# Patient Record
Sex: Male | Born: 1960 | Race: White | Hispanic: No | Marital: Married | Smoking: Never smoker
Health system: Southern US, Community
[De-identification: ages and names within clinical notes are randomized; demographics above are authoritative.]

## PROBLEM LIST (undated history)

## (undated) DIAGNOSIS — K219 Gastro-esophageal reflux disease without esophagitis: Secondary | ICD-10-CM

## (undated) DIAGNOSIS — R011 Cardiac murmur, unspecified: Secondary | ICD-10-CM

## (undated) DIAGNOSIS — R519 Headache, unspecified: Secondary | ICD-10-CM

## (undated) DIAGNOSIS — T4145XA Adverse effect of unspecified anesthetic, initial encounter: Secondary | ICD-10-CM

## (undated) DIAGNOSIS — E7800 Pure hypercholesterolemia, unspecified: Secondary | ICD-10-CM

## (undated) DIAGNOSIS — Z87442 Personal history of urinary calculi: Secondary | ICD-10-CM

## (undated) DIAGNOSIS — R51 Headache: Secondary | ICD-10-CM

## (undated) DIAGNOSIS — M1990 Unspecified osteoarthritis, unspecified site: Secondary | ICD-10-CM

## (undated) DIAGNOSIS — I10 Essential (primary) hypertension: Secondary | ICD-10-CM

## (undated) DIAGNOSIS — H9319 Tinnitus, unspecified ear: Secondary | ICD-10-CM

## (undated) DIAGNOSIS — T8859XA Other complications of anesthesia, initial encounter: Secondary | ICD-10-CM

## (undated) HISTORY — PX: OTHER SURGICAL HISTORY: SHX169

## (undated) HISTORY — PX: COLONOSCOPY: SHX174

## (undated) HISTORY — PX: KNEE ARTHROSCOPY: SUR90

---

## 1990-06-05 HISTORY — PX: TIBIA FRACTURE SURGERY: SHX806

## 2003-01-28 ENCOUNTER — Emergency Department (HOSPITAL_COMMUNITY): Admission: EM | Admit: 2003-01-28 | Discharge: 2003-01-28 | Payer: Self-pay | Admitting: Emergency Medicine

## 2003-06-24 ENCOUNTER — Ambulatory Visit (HOSPITAL_COMMUNITY): Admission: RE | Admit: 2003-06-24 | Discharge: 2003-06-24 | Payer: Self-pay | Admitting: Family Medicine

## 2003-09-09 ENCOUNTER — Ambulatory Visit (HOSPITAL_COMMUNITY): Admission: RE | Admit: 2003-09-09 | Discharge: 2003-09-09 | Payer: Self-pay | Admitting: Orthopedic Surgery

## 2003-09-14 ENCOUNTER — Encounter (HOSPITAL_COMMUNITY): Admission: RE | Admit: 2003-09-14 | Discharge: 2003-10-14 | Payer: Self-pay | Admitting: Orthopedic Surgery

## 2003-10-19 ENCOUNTER — Ambulatory Visit (HOSPITAL_COMMUNITY): Admission: RE | Admit: 2003-10-19 | Discharge: 2003-10-19 | Payer: Self-pay | Admitting: Orthopedic Surgery

## 2004-04-20 ENCOUNTER — Ambulatory Visit: Payer: Self-pay | Admitting: Orthopedic Surgery

## 2004-05-02 ENCOUNTER — Ambulatory Visit (HOSPITAL_COMMUNITY): Admission: RE | Admit: 2004-05-02 | Discharge: 2004-05-02 | Payer: Self-pay | Admitting: Orthopedic Surgery

## 2004-05-11 ENCOUNTER — Ambulatory Visit: Payer: Self-pay | Admitting: Orthopedic Surgery

## 2004-05-23 ENCOUNTER — Ambulatory Visit (HOSPITAL_COMMUNITY): Admission: RE | Admit: 2004-05-23 | Discharge: 2004-05-23 | Payer: Self-pay | Admitting: Family Medicine

## 2004-06-01 ENCOUNTER — Ambulatory Visit (HOSPITAL_COMMUNITY): Admission: RE | Admit: 2004-06-01 | Discharge: 2004-06-01 | Payer: Self-pay | Admitting: Family Medicine

## 2004-08-08 ENCOUNTER — Ambulatory Visit: Payer: Self-pay | Admitting: Orthopedic Surgery

## 2004-08-19 ENCOUNTER — Ambulatory Visit (HOSPITAL_COMMUNITY): Admission: RE | Admit: 2004-08-19 | Discharge: 2004-08-19 | Payer: Self-pay | Admitting: Orthopedic Surgery

## 2004-08-19 ENCOUNTER — Ambulatory Visit: Payer: Self-pay | Admitting: Orthopedic Surgery

## 2004-08-22 ENCOUNTER — Ambulatory Visit: Payer: Self-pay | Admitting: Orthopedic Surgery

## 2004-09-08 ENCOUNTER — Ambulatory Visit: Payer: Self-pay | Admitting: Orthopedic Surgery

## 2004-09-29 ENCOUNTER — Ambulatory Visit: Payer: Self-pay | Admitting: Orthopedic Surgery

## 2004-10-24 ENCOUNTER — Ambulatory Visit: Payer: Self-pay | Admitting: Orthopedic Surgery

## 2004-11-28 ENCOUNTER — Ambulatory Visit: Payer: Self-pay | Admitting: Orthopedic Surgery

## 2005-01-23 ENCOUNTER — Ambulatory Visit: Payer: Self-pay | Admitting: Orthopedic Surgery

## 2006-06-25 ENCOUNTER — Ambulatory Visit: Payer: Self-pay | Admitting: Orthopedic Surgery

## 2006-07-28 IMAGING — RF DG ARTHROGRAM KNEE*R*
1 series · 1 of 1 positions shown · IV contrast (omniscan)
Comparison: none

CLINICAL DATA: 43-year-old with right knee pain.
 RIGHT KNEE ARTHROGRAM:
 Written informed consent was obtained.  I explained the procedure to the patient in detail.  Appropriate site for arthrocentesis was marked on the patient?s skin.  The patient was prepped and draped in the usual sterile fashion.  Local anesthesia was achieved with 2% Xylocaine.  22-gauge needle was then placed into the suprapatellar bursa and a total of 10 cc of a combination of Hypaque-VZ, 2% Xylocaine, and Omniscan was injected.  Fluoroscopic spot images demonstrate contrast throughout the joint.  The patient tolerated the procedure well without immediate complications.

[Series 1: run · 1 of 1 slices shown]
[im 1/1]
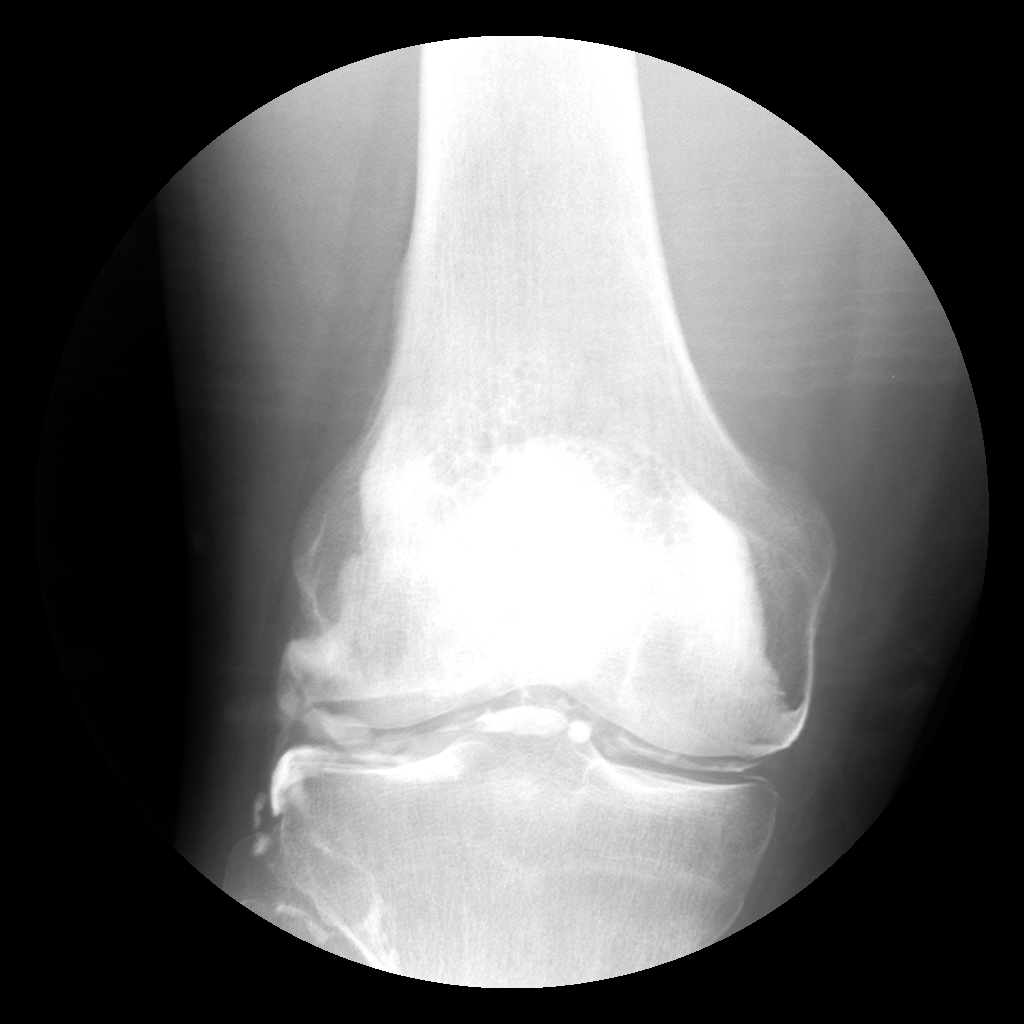

[1 of 1 positions shown; findings below may reference images not displayed]

IMPRESSION: Right knee injection for MRI.

## 2006-09-17 ENCOUNTER — Ambulatory Visit: Payer: Self-pay | Admitting: Orthopedic Surgery

## 2009-09-30 ENCOUNTER — Ambulatory Visit (HOSPITAL_COMMUNITY): Admission: RE | Admit: 2009-09-30 | Discharge: 2009-09-30 | Payer: Self-pay | Admitting: Family Medicine

## 2010-05-25 ENCOUNTER — Ambulatory Visit (HOSPITAL_COMMUNITY)
Admission: RE | Admit: 2010-05-25 | Discharge: 2010-05-25 | Payer: Self-pay | Source: Home / Self Care | Attending: Family Medicine | Admitting: Family Medicine
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
## 2010-10-21 NOTE — Op Note (Signed)
NAME:  Kirk Mueller, Kirk Mueller NO.:  [id number]   MEDICAL RECORD NO.:  [id number]                  PATIENT TYPE:  AMB   LOCATION:                                       FACILITY:   PHYSICIAN:  Vickki Hearing, M.D.           DATE OF BIRTH:  06/10/1971   DATE OF PROCEDURE:  09/09/2003  DATE OF DISCHARGE:                                 OPERATIVE REPORT   Mr. Kirk Mueller is 50 years old.  He had a right knee arthroscopy at [facility] [place]  several years ago did well, until recently, when he started having medial  knee pain.  We did an MRI on him and it showed an osteochondral filling  defect of his femur possible loose body.  He apparently had had a medial  meniscectomy at that time, at [facility] [place].   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   POSTOPERATIVE DIAGNOSIS:  Chondral flap tear tibial plateau right knee.   OPERATIVE FINDINGS:  There were 2 femoral lesions that appeared to be from  previous arthroscopy and were iatrogenic and should not be symptomatic.  There was a chondral flap tear of the tibial plateau in the weightbearing  surface.  There was softening and grade 1 changes of the remaining tibial  plateau approximately 8-10 cm in length.   The remaining portions of the knee showed intense hypertrophy of the fat pad  with scarring, ACL/PCL intact; medial and lateral gutter; lateral meniscus;  lateral compartment intact; patellofemoral joint intact.   PROCEDURE:  Mr. Kirk Mueller was identified properly in the holding area, the  medical record including history and physical and consent form were  reviewed.  The right knee was marked for surgery.  My initials were placed  over the right knee and we proceeded with Ancef and took him to surgery.  He  had a spinal anesthetic.  He was placed supine.  His right leg was prepped  and draped using sterile technique.  At that time we took a time out as  required.  We confirmed  the patient as Kirk Mueller, the procedure as right  knee arthroscopy.  The diagnosis as osteochondral defect.   We did a 3-portal arthroscopy with diagnostic arthroscopy.  I have listed  the findings above.  After thorough inspection of the knee the only lesion  noted was the chondral flap tear of the tibial plateau.  Using a combination  of a turbo whisker, meniscal shaver and rasp, the lesion was debrided.  The  knee was then irrigated, suctioned and the portals were closed with Steri-  Strips.  We injected 30 cc of Marcaine, applied a sterile dressing and a  CryoCuff and then took the patient to the recovery room in stable condition.  The postoperative plan is for him to return to the office in 2 days. He can  start immediate range  of motion exercises and straight leg raises.  He  should be started on chondral supplementation orally, take  antiinflammatories and modify his activities.      ___________________________________________                                            Vickki Hearing, M.D.   SEH/MEDQ  D:  09/09/2003  T:  09/09/2003  Job:  [id number]

## 2010-10-21 NOTE — Op Note (Signed)
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   OPERATIVE FINDINGS:  Patella chondromalacia, medial facet; medial plica;  medial tibial plateau chondromalacia.  Both areas of chondromalacia were  graded as grade 2.   PROCEDURE:  Arthroscopy, chondroplasty and resection of plica.  Chondroplasties were done on the tibial plateau and the medial patellar  facet.   SURGEON:  Vickki Hearing, M.D.   ANESTHETIC:  Spinal.   HISTORY:  This is a 50 year old male who had two knee arthroscopies,  continued to have pain.  Repeat MRI with contrast showed a medial plica,  chondromalacia with fissuring of the medial patellar facet, and also the  possibility of meniscal tear though no meniscal tear was found.   Primary indication was persistent pain.   Giomar Gusler was identified in preop holding area, is history and physical was  updated, as right knee was marked as the surgical site.  He was given Ancef  and taken to the operating room for spinal anesthetic.  After successful anesthesia, his right leg was prepped and draped using the  normal sterile technique.  A time-out was taken and completed as required.  The procedure was confirmed, extremity was confirmed, patient's  identification was confirmed, antibiotics were given, the equipment needed  for surgery was present.   A two-incision arthroscopy was performed.  Diagnostic arthroscopy was done.  The medial plica was resected using a shaver.  The chondroplasty on the  patella  was performed using a straight shaver and a Paragon wand.  This was  also used to perform a tibial plateau chondroplasty.  The knee was then  irrigated and the portals were closed with Steri-Strips.  One accessory  portal was created to better assess the chondral lesion in the patella.   The knee was dressed sterilely and a CryoCoff cuff was placed.  The patient  was taken to the recovery room in stable condition.      SEH/MEDQ  D:  08/20/2004  T:  08/22/2004  Job:  [id number]

## 2010-10-21 NOTE — H&P (Signed)
NAME:  Kirk, Mueller NO.:  [id number]   MEDICAL RECORD NO.:  [id number]          PATIENT TYPE:  AMB   LOCATION:  DAY                           FACILITY:  [facility]   PHYSICIAN:  Vickki Hearing, M.D.DATE OF BIRTH:  11/15/1960   DATE OF ADMISSION:  DATE OF DISCHARGE:  LH                                HISTORY & PHYSICAL   CHIEF COMPLAINT:  Persistent right knee pain.   Kirk Mueller is status post arthroscopy of the right knee on September 09, 2003.  He had had a previous arthroscopy many years ago at [facility]  At the time  of surgery we found a chondral flap tear of his medial tibial plateau.  His  patellofemoral joint was normal.  That tibial lesion was debrided.  He also  had an indentation from previous arthroscopy on his medial femoral condyle,  but the cartilage was intact.   He did well initially in physical therapy, and then as his exercises  increased, he started to have increased pain.  We sought a second opinion in  [place].  They recommended anti-inflammatories and a patellofemoral  brace.  He did that.  He also developed tendinitis.  He was injected for  that.  He was kept in physical therapy.  However, he continues to have  peripatellar pain.  He had a repeat MRI which showed a medial plica.  There  was some degenerative fraying and surface tearing of the medial meniscus,  and there appeared to be a fissure on the medial patellar facette and  perhaps some medial compartment cartilage irregularity which most likely is  from his previous surgery.   The primary problem is he still has pain, and he is unhappy with the way his  knee is functioning and wishes to participate in more vigorous activity and  presents for arthroscopic surgery of the right knee.   REVIEW OF SYSTEMS:  The patient has seasonal allergies, sinusitis, tinnitus.  He has a history of C5-C6 neck injury, left distal tib/fib fracture with  internal fixation and subsequent hardware  removal.  He takes no medications  and has no known allergies.  He has a negative family history.  He is  employed as a Quarry manager, and he is married. He does not smoke or  drink and has an associate's degree.   PHYSICAL EXAMINATION:  Weight is 185.  HEENT:  Normal.  NECK:  Supple.  CHEST:  Clear.  HEART:  Rate and rhythm are normal.  ABDOMEN:  Soft.  EXTREMITIES:  Physical examination of the right knee shows that he does have  a palpable plica over the medial peripatellar area.  There is some  tenderness also on the medial compartment over the tibia.  There is some  tenderness on the medial facette of the patella and medial femoral condyle.  Range of motion is good.  Strength is good.  No atrophy is really noted  clinically.   IMPRESSION:  1.  Plica.  2.  Medial femoral condyle defect.  3.  Tibial plateau defect.  4.  Patellofemoral chondromalacia.  Recommend arthroscopy, right knee.      SEH/MEDQ  D:  08/18/2004  T:  08/18/2004  Job:  [id number]

## 2011-04-10 ENCOUNTER — Encounter: Payer: Self-pay | Admitting: *Deleted

## 2011-04-10 ENCOUNTER — Emergency Department (HOSPITAL_COMMUNITY)
Admission: EM | Admit: 2011-04-10 | Discharge: 2011-04-10 | Disposition: A | Discharge status: Home / Self Care | Payer: BC Managed Care – PPO | Attending: Emergency Medicine | Admitting: Emergency Medicine

## 2011-04-10 DIAGNOSIS — I10 Essential (primary) hypertension: Secondary | ICD-10-CM | POA: Insufficient documentation

## 2011-04-10 DIAGNOSIS — L239 Allergic contact dermatitis, unspecified cause: Secondary | ICD-10-CM

## 2011-04-10 DIAGNOSIS — E7800 Pure hypercholesterolemia, unspecified: Secondary | ICD-10-CM | POA: Insufficient documentation

## 2011-04-10 DIAGNOSIS — L259 Unspecified contact dermatitis, unspecified cause: Secondary | ICD-10-CM | POA: Insufficient documentation

## 2011-04-10 HISTORY — DX: Pure hypercholesterolemia, unspecified: E78.00

## 2011-04-10 MED ORDER — PREDNISONE 10 MG PO TABS: ORAL_TABLET | ORAL | Status: DC

## 2011-04-10 MED ORDER — FAMOTIDINE 20 MG PO TABS
20.0000 mg | ORAL_TABLET | Freq: Once | ORAL | Status: AC
  Administered 2011-04-10: 20 mg via ORAL
  Filled 2011-04-10: qty 1

## 2011-04-10 MED ORDER — DEXAMETHASONE SODIUM PHOSPHATE 10 MG/ML IJ SOLN
10.0000 mg | Freq: Once | INTRAMUSCULAR | Status: AC
  Administered 2011-04-10: 10 mg via INTRAVENOUS
  Filled 2011-04-10: qty 1

## 2011-04-10 MED ORDER — DIPHENHYDRAMINE HCL 25 MG PO CAPS
50.0000 mg | ORAL_CAPSULE | Freq: Once | ORAL | Status: AC
  Administered 2011-04-10: 50 mg via ORAL
  Filled 2011-04-10: qty 2

## 2011-04-10 NOTE — ED Notes (Signed)
Family at bedside. Patient is comfortable but his rash is spreading more through his hands and wrist. Patient states his arm pits and in between his fingers is irritated and itching. RN Kendal Hymen notified.

## 2011-04-10 NOTE — ED Notes (Signed)
Pt c/o rash all over body that started this am; pt states he was wearing gloves this am; pt c/o tingling to lips

## 2011-04-11 ENCOUNTER — Emergency Department (HOSPITAL_COMMUNITY)
Admission: EM | Admit: 2011-04-11 | Discharge: 2011-04-12 | Disposition: A | Discharge status: Home / Self Care | Payer: BC Managed Care – PPO | Attending: Emergency Medicine | Admitting: Emergency Medicine

## 2011-04-11 ENCOUNTER — Encounter (HOSPITAL_COMMUNITY): Payer: Self-pay | Admitting: *Deleted

## 2011-04-11 DIAGNOSIS — L509 Urticaria, unspecified: Secondary | ICD-10-CM | POA: Insufficient documentation

## 2011-04-11 DIAGNOSIS — Z79899 Other long term (current) drug therapy: Secondary | ICD-10-CM | POA: Insufficient documentation

## 2011-04-11 DIAGNOSIS — R21 Rash and other nonspecific skin eruption: Secondary | ICD-10-CM | POA: Insufficient documentation

## 2011-04-11 DIAGNOSIS — I10 Essential (primary) hypertension: Secondary | ICD-10-CM | POA: Insufficient documentation

## 2011-04-11 HISTORY — DX: Essential (primary) hypertension: I10

## 2011-04-11 MED ORDER — LORAZEPAM 2 MG/ML IJ SOLN
1.0000 mg | Freq: Once | INTRAMUSCULAR | Status: AC
  Administered 2011-04-12: 1 mg via INTRAVENOUS
  Filled 2011-04-11: qty 1

## 2011-04-11 MED ORDER — DIPHENHYDRAMINE HCL 50 MG/ML IJ SOLN
25.0000 mg | Freq: Once | INTRAMUSCULAR | Status: AC
  Administered 2011-04-12: 25 mg via INTRAVENOUS
  Filled 2011-04-11: qty 1

## 2011-04-11 NOTE — ED Notes (Signed)
Itching rash,  Seen here for same

## 2011-04-12 ENCOUNTER — Encounter (HOSPITAL_COMMUNITY): Payer: Self-pay | Admitting: Emergency Medicine

## 2011-04-12 MED ORDER — FAMOTIDINE IN NACL 20-0.9 MG/50ML-% IV SOLN: 20.0000 mg | Freq: Once | INTRAVENOUS | Status: DC

## 2011-04-12 MED ORDER — FAMOTIDINE IN NACL 20-0.9 MG/50ML-% IV SOLN
INTRAVENOUS | Status: AC
  Filled 2011-04-12: qty 50

## 2011-04-12 MED ORDER — HYDROXYZINE HCL 25 MG PO TABS: 25.0000 mg | ORAL_TABLET | Freq: Four times a day (QID) | ORAL | Status: AC

## 2011-04-12 MED ORDER — FAMOTIDINE 20 MG PO TABS: 20.0000 mg | ORAL_TABLET | Freq: Two times a day (BID) | ORAL | Status: DC
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
## 2011-04-12 NOTE — ED Provider Notes (Signed)
Medical screening examination/treatment/procedure(s) were performed by non-physician practitioner and as supervising physician I was immediately available for consultation/collaboration.  Jasmine Awe, MD 04/12/11 [phone]

## 2011-04-12 NOTE — ED Provider Notes (Signed)
Medical screening examination/treatment/procedure(s) were performed by non-physician practitioner and as supervising physician I was immediately available for consultation/collaboration.   Shelda Jakes, MD 04/12/11 1134

## 2011-04-12 NOTE — ED Provider Notes (Signed)
History     CSN: [id number] Arrival date & time: 04/11/2011 11:27 PM   First MD Initiated Contact with Patient 04/11/11 2329      Chief Complaint  Patient presents with  . Allergic Reaction    (Consider location/radiation/quality/duration/timing/severity/associated sxs/prior treatment) Patient is a 50 y.o. male presenting with allergic reaction. The history is provided by the patient.  Allergic Reaction The primary symptoms are  rash and urticaria. The primary symptoms do not include wheezing, shortness of breath, cough, abdominal pain, nausea, vomiting, dizziness, palpitations, altered mental status or angioedema. The current episode started yesterday. The problem has been gradually worsening. This is a new problem.  The rash began yesterday. The rash appears on the chest, back, abdomen, left leg, left foot, right arm, right foot, right leg, right hand and left hand. The rash is associated with itching. The rash is not associated with blisters or weeping.  The urticaria began yesterday. The urticaria has been gradually worsening since its onset. Urticaria is a new problem. Urticaria is located on the back, chest, abdomen, left arm, left hand, left leg, left foot, right arm, right hand, right leg and right foot.  Associated with: unknown. Significant symptoms also include itching.    Past Medical History  Diagnosis Date  . Hypercholesteremia   . Hypertension     Past Surgical History  Procedure Date  . Colonoscopy   . Left leg surgery   . Knee arthroscopy right  . Brown recluse bite     History reviewed. No pertinent family history.  History  Substance Use Topics  . Smoking status: Never Smoker   . Smokeless tobacco: Not on file  . Alcohol Use: No      Review of Systems  Constitutional: Negative for fever, chills, activity change, appetite change and fatigue.  HENT: Negative for sore throat, facial swelling, trouble swallowing, neck pain and neck stiffness.   Eyes:  Negative for visual disturbance.  Respiratory: Negative for cough, chest tightness, shortness of breath and wheezing.   Cardiovascular: Negative for chest pain and palpitations.  Gastrointestinal: Negative for nausea, vomiting and abdominal pain.  Genitourinary: Negative for dysuria.  Musculoskeletal: Negative for myalgias, back pain and arthralgias.  Skin: Positive for itching and rash.  Neurological: Negative for dizziness, weakness and numbness.  Hematological: Negative for adenopathy. Does not bruise/bleed easily.  Psychiatric/Behavioral: Negative for confusion, decreased concentration and altered mental status.  All other systems reviewed and are negative.    Allergies  Review of patient's allergies indicates no known allergies.  Home Medications   Current Outpatient Rx  Name Route Sig Dispense Refill  . CELECOXIB 200 MG PO CAPS Oral Take 200 mg by mouth 2 (two) times daily. As needed     . DIPHENHYDRAMINE HCL (SLEEP) 25 MG PO TABS Oral Take 50 mg by mouth at bedtime as needed. As needed , took pta     . ROSUVASTATIN CALCIUM 5 MG PO TABS Oral Take 5 mg by mouth daily.      Marland Kitchen PREDNISONE 10 MG PO TABS  Take 6 tabs daily by mouth for 1day,  Then 5 tab daily for 1day,  4 tabs daily for 1 day,  3 tabs daily for 1 day,  2 tabs daily for 1 day,  Then 1 tab daily for 1 day. 15 tablet 0    BP 139/94  Pulse 74  Temp(Src) 97.8 F (36.6 C) (Oral)  Resp 20  Wt 184 lb (83.462 kg)  SpO2 100%  Physical Exam  Nursing note and vitals reviewed. Constitutional: He is oriented to person, place, and time. He appears well-developed and well-nourished. No distress.  HENT:  Head: Normocephalic and atraumatic.  Mouth/Throat: Oropharynx is clear and moist.  Eyes: EOM are normal. Pupils are equal, round, and reactive to light.  Neck: Normal range of motion. Neck supple.  Cardiovascular: Normal rate, regular rhythm and normal heart sounds.   Pulmonary/Chest: Effort normal and breath sounds  normal. No respiratory distress. He exhibits no tenderness.  Abdominal: Soft. He exhibits no distension. There is no tenderness.  Musculoskeletal: Normal range of motion. He exhibits no edema and no tenderness.  Lymphadenopathy:    He has no cervical adenopathy.  Neurological: He is alert and oriented to person, place, and time. He displays normal reflexes. No cranial nerve deficit. He exhibits normal muscle tone. Coordination normal.  Skin: Skin is warm and dry. Rash noted. No petechiae noted. Rash is urticarial. Rash is not nodular and not vesicular.        Diffuse, patchy hives to the trunk and bilateral upper and lower extremities.    ED Course  Procedures (including critical care time)       MDM     12:01 AM patient is alert, appears anxious.  NAD.  No edema or airway compromise.  Diffuse, patchy hives to the trunk and bilateral upper and lower extremities.  Patient is currently taking po steroids and received steroid injection today by his PMD.    1:01 AM patient is feeling better. Drinking fluids.  Hives have resolved.  Airway remains clear.  I will have pt continue his prednisone, d/c benadryl and start hydroxyzine for the itching and also begin pepcid for one week and zyrtec.  Also advised to d/c the celebrex.   Pt agrees to f/u with his PMD or return here if needed.       Prima Rayner L. Porschia [place], [place] 04/12/11 0105

## 2016-04-24 NOTE — Progress Notes (Signed)
Scheduling pre op-  PLEASE PLACE SURGICAL ORDERS IN EPIC   thanks 

## 2016-04-25 ENCOUNTER — Ambulatory Visit: Payer: Self-pay | Admitting: Orthopedic Surgery

## 2016-05-08 ENCOUNTER — Ambulatory Visit: Payer: Self-pay | Admitting: Orthopedic Surgery

## 2016-05-08 NOTE — H&P (Signed)
Patterson HammersmithJohn H Griffie is an 55 y.o. male.   Chief Complaint: RLE radicular pain HPI: The patient is a 55 year old male who presents with back pain. The patient reports low back symptoms including pain which began 4 month(s) ago without any known injury. Symptoms are reported to be located in the low back and Symptoms include pain, numbness and weakness (RLE). The pain radiates to the right buttock, right lateral thigh and right lateral lower leg. The patient describes the pain as sharp. The patient describes the severity of their symptoms as severe. Symptoms are exacerbated by sitting. Current treatment includes non-opioid analgesics (Tramadol) and oral corticosteroids. Prior to being seen today the patient was previously evaluated by Dr. Zachery DauerBarnes and Dr. Ethelene Halamos. Past evaluation has included x-ray of the lumbar spine and MRI of the lumbar spine. Past treatment has included non-opioid analgesics, opioid analgesics, corticosteroids and physical therapy.  Reports persistent right lower extremity radicular pain, worse with activity, better with rest. He has had this for close to over four months. He has had physical therapy, activity modification, epidural at L4-5. He has reported even longer than that. He was in the [employer]. He did a lot of jumping out of planes. 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 Disc degeneration is noted at L5-S1 to the left and small disc protrusion. It will radiate down into the ankle and outside of the ankle. Minimal back pain.  He did physical therapy as well to no avail.  He has also gone to the Emergency Room.  Past Medical History:  Diagnosis Date  . Hypercholesteremia   . Hypertension     Past Surgical History:  Procedure Laterality Date  . brown recluse bite    . COLONOSCOPY    . KNEE ARTHROSCOPY  right  . left leg surgery      No family history on file. Social History:  reports that he has never smoked. He does not have any smokeless  tobacco history on file. He reports that he does not drink alcohol or use drugs.  Allergies: No Known Allergies   (Not in a hospital admission)  No results found for this or any previous visit (from the past 48 hour(s)). No results found.  Review of Systems  Constitutional: Negative.   HENT: Negative.   Eyes: Negative.   Respiratory: Negative.   Cardiovascular: Negative.   Gastrointestinal: Negative.   Genitourinary: Negative.   Musculoskeletal: Positive for back pain.  Skin: Negative.   Neurological: Positive for sensory change and focal weakness.  Psychiatric/Behavioral: Negative.     There were no vitals taken for this visit. Physical Exam  Constitutional: He is oriented to person, place, and time. He appears well-developed. He appears distressed.  HENT:  Head: Normocephalic.  Eyes: Pupils are equal, round, and reactive to light.  Neck: Normal range of motion.  Cardiovascular: Normal rate.   Respiratory: Effort normal.  GI: Soft.  Musculoskeletal:  Severe distress. Standing. Walks with an antalgic gait. Mood and affect his appropriate. Straight leg raise, buttock, thigh, and calf pain on the right, negative on the left. EHLs 4+/5 on the right compared to the left, slightly diminished repetitive plantarflexion. Slightly diminished Achilles reflex on the right. Lumbar spine exam reveals no evidence of soft tissue swelling, deformity or skin ecchymosis. On palpation there is no tenderness of the lumbar spine. No flank pain with percussion. The abdomen is soft and nontender. Nontender over the trochanters. No cellulitis or lymphadenopathy.  Good range of motion of the lumbar spine without associated pain. Straight leg raise is negative. Motor is 5/5 including EHL, tibialis anterior, plantar flexion, quadriceps and hamstrings. Patient is normoreflexic. There is no Babinski or clonus. Sensory exam is intact to light touch. Patient has good distal pulses. No DVT. No pain and normal  range of motion without instability of the hips, knees and ankles.  Neurological: He is alert and oriented to person, place, and time.    Three-view radiographs, AP, lateral, flexion and extension demonstrate disc space narrowing at L4-5 and L5-S1. Mo instability in flexion or extension. No pars defect. Mild osteoarthrosis of the hips.  MRI demonstrates a caudal displacement of the L4-5 disc down into the foramen of L5, near the foramen of L5.  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  We discussed options. He has failed conservative treatment and presence of neurologic deficit. We discussed proceeding with a micro lumbar decompression.  I had an extensive discussion of the risks and benefits of the lumbar decompression with the patient including bleeding, infection, damage to neurovascular structures, epidural fibrosis, CSF leak requiring repair. We also discussed increase in pain, adjacent segment disease, recurrent disc herniation, need for future surgery including repeat decompression and/or fusion. We also discussed risks of postoperative hematoma, paralysis, anesthetic complications including DVT, PE, death, cardiopulmonary dysfunction. In addition, the perioperative and postoperative courses were discussed in detail including the rehabilitative time and return to functional activity and work. I provided the patient with an illustrated handout and utilized the appropriate surgical models.  It indicate though that we will most likely require entering through two interlaminar windows given that it is caudally extruded beneath the L5 lamina. He does have some S1 symptomatology. I will proceed as soon as possible and to have that scheduled. I appreciate the kind referral by Dr. Ethelene Halamos. If he has any worsening in the interim, he is to call. We discussed activity modification and postoperative course in  detail. I placed him on gabapentin.  Plan microlumbar decompression L4-5, possible L5-S1  Dorothy SparkBISSELL, JACLYN M., PA-C for Dr. Shelle IronBeane 05/08/2016, 2:11 PM

## 2016-05-10 NOTE — Patient Instructions (Signed)
Patterson HammersmithJohn H Viglione  05/10/2016   Your procedure is scheduled on: 05/17/2016    [facility]  [facility] to 3rd floor to  Short Stay Center at   0900 AM.  Call this number if you have problems the morning of surgery [phone]   Remember: ONLY 1 PERSON MAY GO WITH YOU TO SHORT STAY TO GET  READY MORNING OF YOUR SURGERY.  Do not eat food or drink liquids :After Midnight.     Take these medicines the morning of surgery with A SIP OF WATER: Metoprolol ( Lopressor), Gabapentin ( Neurontin)                                 You may not have any metal on your body including hair pins and              piercings  Do not wear jewelry, , lotions, powders or perfumes, deodorant .              Men may shave face and neck.   Do not bring valuables to the hospital. [facility] IS NOT             RESPONSIBLE   FOR VALUABLES.  Contacts, dentures or bridgework may not be worn into surgery.  Leave suitcase in the car. After surgery it may be brought to your room.        Special Instructions: N/A              Please read over the following fact sheets you were given: _____________________________________________________________________             [facility] Pre[facility] Before surgery, you can play an important role.  Because skin is not sterile, your skin needs to be as free of germs as possible.  You can reduce the number of germs on your skin by washing with CHG (chlorahexidine gluconate) soap before surgery.  CHG is an antiseptic cleaner which kills germs and bonds with the skin to continue killing germs even after washing. Please DO NOT use if you have an allergy to CHG or antibacterial soaps.  If your skin becomes reddened/irritated stop using the CHG and inform your nurse when you arrive at Short Stay. Do not shave (including legs and underarms) for at least 48 hours prior to the first CHG shower.  You may shave your  face/neck. Please follow these instructions carefully:  1.  Shower with CHG Soap the night before surgery and the  morning of Surgery.  2.  If you choose to wash your hair, wash your hair first as usual with your  normal  shampoo.  3.  After you shampoo, rinse your hair and body thoroughly to remove the  shampoo.                           4.  Use CHG as you would any other liquid soap.  You can apply chg directly  to the skin and wash                       Gently with a scrungie or clean washcloth.  5.  Apply the CHG Soap to your body ONLY FROM THE NECK DOWN.  Do not use on face/ open                           Wound or open sores. Avoid contact with eyes, ears mouth and genitals (private parts).                       Wash face,  Genitals (private parts) with your normal soap.             6.  Wash thoroughly, paying special attention to the area where your surgery  will be performed.  7.  Thoroughly rinse your body with warm water from the neck down.  8.  DO NOT shower/wash with your normal soap after using and rinsing off  the CHG Soap.                9.  Pat yourself dry with a clean towel.            10.  Wear clean pajamas.            11.  Place clean sheets on your bed the night of your first shower and do not  sleep with pets. Day of Surgery : Do not apply any lotions/deodorants the morning of surgery.  Please wear clean clothes to the hospital/surgery center.  FAILURE TO FOLLOW THESE INSTRUCTIONS MAY RESULT IN THE CANCELLATION OF YOUR SURGERY PATIENT SIGNATURE_________________________________  NURSE SIGNATURE__________________________________  ________________________________________________________________________  WHAT IS A BLOOD TRANSFUSION? Blood Transfusion Information  A transfusion is the replacement of blood or some of its parts. Blood is made up of multiple cells which provide different functions.  Red blood cells carry oxygen and are used for blood loss  replacement.  White blood cells fight against infection.  Platelets control bleeding.  Plasma helps clot blood.  Other blood products are available for specialized needs, such as hemophilia or other clotting disorders. BEFORE THE TRANSFUSION  Who gives blood for transfusions?   Healthy volunteers who are fully evaluated to make sure their blood is safe. This is blood bank blood. Transfusion therapy is the safest it has ever been in the practice of medicine. Before blood is taken from a donor, a complete history is taken to make sure that person has no history of diseases nor engages in risky social behavior (examples are intravenous drug use or sexual activity with multiple partners). The donor's travel history is screened to minimize risk of transmitting infections, such as malaria. The donated blood is tested for signs of infectious diseases, such as HIV and hepatitis. The blood is then tested to be sure it is compatible with you in order to minimize the chance of a transfusion reaction. If you or a relative donates blood, this is often done in anticipation of surgery and is not appropriate for emergency situations. It takes many days to process the donated blood. RISKS AND COMPLICATIONS Although transfusion therapy is very safe and saves many lives, the main dangers of transfusion include:   Getting an infectious disease.  Developing a transfusion reaction. This is an allergic reaction to something in the blood you were given. Every precaution is taken to prevent this. The decision to have a blood transfusion has been considered carefully by your caregiver before blood is given. Blood is not given unless the benefits outweigh the risks. AFTER THE TRANSFUSION  Right after receiving a blood transfusion, you will usually feel much better and more energetic. This is especially  true if your red blood cells have gotten low (anemic). The transfusion raises the level of the red blood cells which  carry oxygen, and this usually causes an energy increase.  The nurse administering the transfusion will monitor you carefully for complications. HOME CARE INSTRUCTIONS  No special instructions are needed after a transfusion. You may find your energy is better. Speak with your caregiver about any limitations on activity for underlying diseases you may have. SEEK MEDICAL CARE IF:   Your condition is not improving after your transfusion.  You develop redness or irritation at the intravenous (IV) site. SEEK IMMEDIATE MEDICAL CARE IF:  Any of the following symptoms occur over the next 12 hours:  Shaking chills.  You have a temperature by mouth above 102 F (38.9 C), not controlled by medicine.  Chest, back, or muscle pain.  People around you feel you are not acting correctly or are confused.  Shortness of breath or difficulty breathing.  Dizziness and fainting.  You get a rash or develop hives.  You have a decrease in urine output.  Your urine turns a dark color or changes to pink, red, or brown. Any of the following symptoms occur over the next 10 days:  You have a temperature by mouth above 102 F (38.9 C), not controlled by medicine.  Shortness of breath.  Weakness after normal activity.  The white part of the eye turns yellow (jaundice).  You have a decrease in the amount of urine or are urinating less often.  Your urine turns a dark color or changes to pink, red, or brown. Document Released: 05/19/2000 Document Revised: 08/14/2011 Document Reviewed: 01/06/2008 ExitCare Patient Information 2014 [place].  _______________________________________________________________________  Incentive Spirometer  An incentive spirometer is a tool that can help keep your lungs clear and active. This tool measures how well you are filling your lungs with each breath. Taking long deep breaths may help reverse or decrease the chance of developing breathing (pulmonary) problems  (especially infection) following:  A long period of time when you are unable to move or be active. BEFORE THE PROCEDURE   If the spirometer includes an indicator to show your best effort, your nurse or respiratory therapist will set it to a desired goal.  If possible, sit up straight or lean slightly forward. Try not to slouch.  Hold the incentive spirometer in an upright position. INSTRUCTIONS FOR USE  1. Sit on the edge of your bed if possible, or sit up as far as you can in bed or on a chair. 2. Hold the incentive spirometer in an upright position. 3. Breathe out normally. 4. Place the mouthpiece in your mouth and seal your lips tightly around it. 5. Breathe in slowly and as deeply as possible, raising the piston or the ball toward the top of the column. 6. Hold your breath for 3-5 seconds or for as long as possible. Allow the piston or ball to fall to the bottom of the column. 7. Remove the mouthpiece from your mouth and breathe out normally. 8. Rest for a few seconds and repeat Steps 1 through 7 at least 10 times every 1-2 hours when you are awake. Take your time and take a few normal breaths between deep breaths. 9. The spirometer may include an indicator to show your best effort. Use the indicator as a goal to work toward during each repetition. 10. After each set of 10 deep breaths, practice coughing to be sure your lungs are clear. If you have  an incision (the cut made at the time of surgery), support your incision when coughing by placing a pillow or rolled up towels firmly against it. Once you are able to get out of bed, walk around indoors and cough well. You may stop using the incentive spirometer when instructed by your caregiver.  RISKS AND COMPLICATIONS  Take your time so you do not get dizzy or light-headed.  If you are in pain, you may need to take or ask for pain medication before doing incentive spirometry. It is harder to take a deep breath if you are having  pain. AFTER USE  Rest and breathe slowly and easily.  It can be helpful to keep track of a log of your progress. Your caregiver can provide you with a simple table to help with this. If you are using the spirometer at home, follow these instructions: [place] IF:   You are having difficultly using the spirometer.  You have trouble using the spirometer as often as instructed.  Your pain medication is not giving enough relief while using the spirometer.  You develop fever of 100.5 F (38.1 C) or higher. SEEK IMMEDIATE MEDICAL CARE IF:   You cough up bloody sputum that had not been present before.  You develop fever of 102 F (38.9 C) or greater.  You develop worsening pain at or near the incision site. MAKE SURE YOU:   Understand these instructions.  Will watch your condition.  Will get help right away if you are not doing well or get worse. Document Released: 10/02/2006 Document Revised: 08/14/2011 Document Reviewed: 12/03/2006 [facility] Patient Information 2014 [place], [place].   ________________________________________________________________________

## 2016-05-12 ENCOUNTER — Ambulatory Visit (HOSPITAL_COMMUNITY)
Admission: RE | Admit: 2016-05-12 | Discharge: 2016-05-12 | Disposition: A | Discharge status: Home / Self Care | Payer: BLUE CROSS/BLUE SHIELD | Source: Ambulatory Visit | Attending: Orthopedic Surgery | Admitting: Orthopedic Surgery

## 2016-05-12 ENCOUNTER — Encounter (HOSPITAL_COMMUNITY)
Admission: RE | Admit: 2016-05-12 | Discharge: 2016-05-12 | Disposition: A | Discharge status: Home / Self Care | Payer: BLUE CROSS/BLUE SHIELD | Source: Ambulatory Visit | Attending: Specialist | Admitting: Specialist

## 2016-05-12 ENCOUNTER — Encounter (HOSPITAL_COMMUNITY): Payer: Self-pay

## 2016-05-12 DIAGNOSIS — R51 Headache: Secondary | ICD-10-CM | POA: Insufficient documentation

## 2016-05-12 DIAGNOSIS — I10 Essential (primary) hypertension: Secondary | ICD-10-CM | POA: Diagnosis not present

## 2016-05-12 DIAGNOSIS — Z01812 Encounter for preprocedural laboratory examination: Secondary | ICD-10-CM | POA: Insufficient documentation

## 2016-05-12 DIAGNOSIS — M1009 Idiopathic gout, multiple sites: Secondary | ICD-10-CM | POA: Diagnosis not present

## 2016-05-12 DIAGNOSIS — M5126 Other intervertebral disc displacement, lumbar region: Secondary | ICD-10-CM

## 2016-05-12 DIAGNOSIS — Z01810 Encounter for preprocedural cardiovascular examination: Secondary | ICD-10-CM | POA: Insufficient documentation

## 2016-05-12 DIAGNOSIS — E7800 Pure hypercholesterolemia, unspecified: Secondary | ICD-10-CM | POA: Insufficient documentation

## 2016-05-12 DIAGNOSIS — R001 Bradycardia, unspecified: Secondary | ICD-10-CM | POA: Diagnosis not present

## 2016-05-12 DIAGNOSIS — K219 Gastro-esophageal reflux disease without esophagitis: Secondary | ICD-10-CM | POA: Insufficient documentation

## 2016-05-12 HISTORY — DX: Adverse effect of unspecified anesthetic, initial encounter: T41.45XA

## 2016-05-12 HISTORY — DX: Gastro-esophageal reflux disease without esophagitis: K21.9

## 2016-05-12 HISTORY — DX: Headache: R51

## 2016-05-12 HISTORY — DX: Unspecified osteoarthritis, unspecified site: M19.90

## 2016-05-12 HISTORY — DX: Other complications of anesthesia, initial encounter: T88.59XA

## 2016-05-12 HISTORY — DX: Cardiac murmur, unspecified: R01.1

## 2016-05-12 HISTORY — DX: Headache, unspecified: R51.9

## 2016-05-12 LAB — CBC
HCT: 46.1 % (ref 39.0–52.0)
Hemoglobin: 16.4 g/dL (ref 13.0–17.0)
MCH: 31.5 pg (ref 26.0–34.0)
MCHC: 35.6 g/dL (ref 30.0–36.0)
MCV: 88.7 fL (ref 78.0–100.0)
PLATELETS: 206 10*3/uL (ref 150–400)
RBC: 5.2 MIL/uL (ref 4.22–5.81)
RDW: 12.3 % (ref 11.5–15.5)
WBC: 7 10*3/uL (ref 4.0–10.5)

## 2016-05-12 LAB — BASIC METABOLIC PANEL
Anion gap: 9 (ref 5–15)
BUN: 15 mg/dL (ref 6–20)
CALCIUM: 9.8 mg/dL (ref 8.9–10.3)
CO2: 23 mmol/L (ref 22–32)
CREATININE: 0.9 mg/dL (ref 0.61–1.24)
Chloride: 106 mmol/L (ref 101–111)
GFR calc Af Amer: >60 mL/min (ref >60)
GFR calc non Af Amer: >60 mL/min (ref >60)
GLUCOSE: 88 mg/dL (ref 65–99)
Potassium: 4 mmol/L (ref 3.5–5.1)
Sodium: 138 mmol/L (ref 135–145)

## 2016-05-12 LAB — SURGICAL PCR SCREEN
MRSA, PCR: INVALID — AB
Staphylococcus aureus: INVALID — AB

## 2016-05-12 NOTE — Progress Notes (Addendum)
Patient had blood pressure of 140/100 at beginning of preop visit.  Patient is in obvious back and leg pain.  Patient reports pain and did not eat lunch. Patient voices no other complaints.  At end of preop visit blood pressure was 139/100.  Patient to leave preop appointment to eat late lunch .    Wife with patient.  Patient reports his blood has been elevated recently due to back and leg pain.

## 2016-05-12 NOTE — Progress Notes (Signed)
Called office of Dr Doreen Beamhruv Vyas and spoke with nurse of Dr Sherril CroonVyas .  Reported previous blood pressure readings of 139-140/100 at preop appointment.  I told her I had faxed to Dr Sherril CroonVyas these readings and she stated she would also let Dr Sherril CroonVyas be aware.

## 2016-05-12 NOTE — Progress Notes (Signed)
Patient has scored a " 5" on the STOP BANG Assessment Tool during a preop visit.  This patient is considered at high risk for Obstructive Sleep Apnea using this tool.  FYI.

## 2016-05-13 LAB — ABO/RH: ABO/RH(D): O POS

## 2016-05-14 LAB — MRSA CULTURE: CULTURE: NOT DETECTED

## 2016-05-15 NOTE — Progress Notes (Signed)
Final EKG done 05/12/16- EPIC

## 2016-05-17 ENCOUNTER — Ambulatory Visit (HOSPITAL_COMMUNITY): Payer: BLUE CROSS/BLUE SHIELD

## 2016-05-17 ENCOUNTER — Ambulatory Visit (HOSPITAL_COMMUNITY)
Admission: RE | Admit: 2016-05-17 | Discharge: 2016-05-18 | Disposition: A | Discharge status: Home / Self Care | Payer: BLUE CROSS/BLUE SHIELD | Source: Ambulatory Visit | Attending: Specialist | Admitting: Specialist

## 2016-05-17 ENCOUNTER — Encounter (HOSPITAL_COMMUNITY): Payer: Self-pay | Admitting: General Practice

## 2016-05-17 ENCOUNTER — Encounter (HOSPITAL_COMMUNITY)
Admission: RE | Disposition: A | Discharge status: Home / Self Care | Payer: Self-pay | Source: Ambulatory Visit | Attending: Specialist

## 2016-05-17 ENCOUNTER — Ambulatory Visit (HOSPITAL_COMMUNITY): Payer: BLUE CROSS/BLUE SHIELD | Admitting: Certified Registered Nurse Anesthetist

## 2016-05-17 DIAGNOSIS — K219 Gastro-esophageal reflux disease without esophagitis: Secondary | ICD-10-CM | POA: Insufficient documentation

## 2016-05-17 DIAGNOSIS — M48061 Spinal stenosis, lumbar region without neurogenic claudication: Secondary | ICD-10-CM | POA: Diagnosis present

## 2016-05-17 DIAGNOSIS — E7800 Pure hypercholesterolemia, unspecified: Secondary | ICD-10-CM | POA: Insufficient documentation

## 2016-05-17 DIAGNOSIS — M5417 Radiculopathy, lumbosacral region: Secondary | ICD-10-CM | POA: Diagnosis not present

## 2016-05-17 DIAGNOSIS — Z419 Encounter for procedure for purposes other than remedying health state, unspecified: Secondary | ICD-10-CM

## 2016-05-17 DIAGNOSIS — R011 Cardiac murmur, unspecified: Secondary | ICD-10-CM | POA: Insufficient documentation

## 2016-05-17 DIAGNOSIS — M5126 Other intervertebral disc displacement, lumbar region: Secondary | ICD-10-CM

## 2016-05-17 DIAGNOSIS — I10 Essential (primary) hypertension: Secondary | ICD-10-CM | POA: Diagnosis not present

## 2016-05-17 HISTORY — PX: LUMBAR LAMINECTOMY/DECOMPRESSION MICRODISCECTOMY: SHX5026

## 2016-05-17 LAB — TYPE AND SCREEN
ABO/RH(D): O POS
Antibody Screen: NEGATIVE

## 2016-05-17 SURGERY — LUMBAR LAMINECTOMY/DECOMPRESSION MICRODISCECTOMY 2 LEVELS
Anesthesia: General | Site: Back

## 2016-05-17 MED ORDER — METOPROLOL TARTRATE 12.5 MG HALF TABLET
12.5000 mg | ORAL_TABLET | Freq: Two times a day (BID) | ORAL | Status: DC
  Administered 2016-05-17 – 2016-05-18 (×2): 12.5 mg via ORAL
  Filled 2016-05-17 (×2): qty 1

## 2016-05-17 MED ORDER — POLYETHYLENE GLYCOL 3350 17 G PO PACK: 17.0000 g | PACK | Freq: Every day | ORAL | 0 refills | Status: DC

## 2016-05-17 MED ORDER — DEXAMETHASONE SODIUM PHOSPHATE 10 MG/ML IJ SOLN
INTRAMUSCULAR | Status: DC | PRN
  Administered 2016-05-17: 10 mg via INTRAVENOUS

## 2016-05-17 MED ORDER — METHOCARBAMOL 500 MG PO TABS: 500.0000 mg | ORAL_TABLET | Freq: Four times a day (QID) | ORAL | 1 refills | Status: DC | PRN

## 2016-05-17 MED ORDER — ONDANSETRON HCL 4 MG/2ML IJ SOLN
INTRAMUSCULAR | Status: DC | PRN
  Administered 2016-05-17: 4 mg via INTRAVENOUS

## 2016-05-17 MED ORDER — OXYCODONE-ACETAMINOPHEN 5-325 MG PO TABS
1.0000 | ORAL_TABLET | ORAL | 0 refills | Status: DC | PRN
Start: 2016-05-17 — End: 2020-10-01

## 2016-05-17 MED ORDER — SUGAMMADEX SODIUM 200 MG/2ML IV SOLN
INTRAVENOUS | Status: AC
  Filled 2016-05-17: qty 2

## 2016-05-17 MED ORDER — RISAQUAD PO CAPS
1.0000 | ORAL_CAPSULE | Freq: Every day | ORAL | Status: DC
  Administered 2016-05-17 – 2016-05-18 (×2): 1 via ORAL
  Filled 2016-05-17 (×2): qty 1

## 2016-05-17 MED ORDER — PHENYLEPHRINE HCL 10 MG/ML IJ SOLN
INTRAMUSCULAR | Status: DC | PRN
  Administered 2016-05-17: 80 ug via INTRAVENOUS
  Administered 2016-05-17: 160 ug via INTRAVENOUS
  Administered 2016-05-17 (×2): 80 ug via INTRAVENOUS

## 2016-05-17 MED ORDER — MENTHOL 3 MG MT LOZG: 1.0000 | LOZENGE | OROMUCOSAL | Status: DC | PRN

## 2016-05-17 MED ORDER — PROPOFOL 10 MG/ML IV BOLUS
INTRAVENOUS | Status: AC
  Filled 2016-05-17: qty 20

## 2016-05-17 MED ORDER — SODIUM CHLORIDE 0.9 % IR SOLN
Status: AC
  Filled 2016-05-17: qty 500000

## 2016-05-17 MED ORDER — PROMETHAZINE HCL 25 MG/ML IJ SOLN
6.2500 mg | INTRAMUSCULAR | Status: DC | PRN
Start: 2016-05-17 — End: 2016-05-17

## 2016-05-17 MED ORDER — CEFAZOLIN SODIUM-DEXTROSE 2-4 GM/100ML-% IV SOLN
INTRAVENOUS | Status: AC
  Filled 2016-05-17: qty 100

## 2016-05-17 MED ORDER — ROCURONIUM BROMIDE 50 MG/5ML IV SOSY
PREFILLED_SYRINGE | INTRAVENOUS | Status: AC
  Filled 2016-05-17: qty 5

## 2016-05-17 MED ORDER — LIDOCAINE 2% (20 MG/ML) 5 ML SYRINGE
INTRAMUSCULAR | Status: AC
  Filled 2016-05-17: qty 5

## 2016-05-17 MED ORDER — PHENYLEPHRINE 40 MCG/ML (10ML) SYRINGE FOR IV PUSH (FOR BLOOD PRESSURE SUPPORT)
PREFILLED_SYRINGE | INTRAVENOUS | Status: AC
  Filled 2016-05-17: qty 10

## 2016-05-17 MED ORDER — HYDROMORPHONE HCL 1 MG/ML IJ SOLN
0.2500 mg | INTRAMUSCULAR | Status: DC | PRN
  Administered 2016-05-17 (×2): 0.5 mg via INTRAVENOUS

## 2016-05-17 MED ORDER — ONDANSETRON HCL 4 MG/2ML IJ SOLN: 4.0000 mg | INTRAMUSCULAR | Status: DC | PRN

## 2016-05-17 MED ORDER — FENTANYL CITRATE (PF) 100 MCG/2ML IJ SOLN
INTRAMUSCULAR | Status: AC
  Filled 2016-05-17: qty 4

## 2016-05-17 MED ORDER — FENTANYL CITRATE (PF) 100 MCG/2ML IJ SOLN
INTRAMUSCULAR | Status: DC | PRN
  Administered 2016-05-17: 25 ug via INTRAVENOUS
  Administered 2016-05-17 (×2): 50 ug via INTRAVENOUS

## 2016-05-17 MED ORDER — METHOCARBAMOL 500 MG PO TABS: 500.0000 mg | ORAL_TABLET | Freq: Four times a day (QID) | ORAL | Status: DC | PRN

## 2016-05-17 MED ORDER — KCL IN DEXTROSE-NACL 20-5-0.45 MEQ/L-%-% IV SOLN
INTRAVENOUS | Status: DC
  Administered 2016-05-17: 16:00:00 via INTRAVENOUS
  Filled 2016-05-17 (×2): qty 1000

## 2016-05-17 MED ORDER — METHOCARBAMOL 1000 MG/10ML IJ SOLN
500.0000 mg | Freq: Four times a day (QID) | INTRAVENOUS | Status: DC | PRN
  Administered 2016-05-17: 500 mg via INTRAVENOUS
  Filled 2016-05-17: qty 550
  Filled 2016-05-17: qty 5

## 2016-05-17 MED ORDER — OXYCODONE-ACETAMINOPHEN 5-325 MG PO TABS
1.0000 | ORAL_TABLET | ORAL | Status: DC | PRN
  Administered 2016-05-17 – 2016-05-18 (×3): 1 via ORAL
  Filled 2016-05-17 (×3): qty 1

## 2016-05-17 MED ORDER — HYDROCODONE-ACETAMINOPHEN 5-325 MG PO TABS: 1.0000 | ORAL_TABLET | ORAL | Status: DC | PRN

## 2016-05-17 MED ORDER — EPHEDRINE 5 MG/ML INJ
INTRAVENOUS | Status: AC
  Filled 2016-05-17: qty 10

## 2016-05-17 MED ORDER — MAGNESIUM CITRATE PO SOLN: 1.0000 | Freq: Once | ORAL | Status: DC | PRN

## 2016-05-17 MED ORDER — BISACODYL 5 MG PO TBEC: 5.0000 mg | DELAYED_RELEASE_TABLET | Freq: Every day | ORAL | Status: DC | PRN

## 2016-05-17 MED ORDER — CEFAZOLIN SODIUM-DEXTROSE 2-4 GM/100ML-% IV SOLN
2.0000 g | INTRAVENOUS | Status: AC
  Administered 2016-05-17: 2 g via INTRAVENOUS

## 2016-05-17 MED ORDER — SUGAMMADEX SODIUM 200 MG/2ML IV SOLN
INTRAVENOUS | Status: DC | PRN
  Administered 2016-05-17: 200 mg via INTRAVENOUS

## 2016-05-17 MED ORDER — PHENOL 1.4 % MT LIQD: 1.0000 | OROMUCOSAL | Status: DC | PRN

## 2016-05-17 MED ORDER — LIDOCAINE 2% (20 MG/ML) 5 ML SYRINGE
INTRAMUSCULAR | Status: DC | PRN
  Administered 2016-05-17: 80 mg via INTRAVENOUS

## 2016-05-17 MED ORDER — DOCUSATE SODIUM 100 MG PO CAPS
100.0000 mg | ORAL_CAPSULE | Freq: Two times a day (BID) | ORAL | Status: DC
  Administered 2016-05-17 – 2016-05-18 (×2): 100 mg via ORAL
  Filled 2016-05-17 (×2): qty 1

## 2016-05-17 MED ORDER — THROMBIN 5000 UNITS EX SOLR
CUTANEOUS | Status: AC
  Filled 2016-05-17: qty 5000

## 2016-05-17 MED ORDER — POLYETHYLENE GLYCOL 3350 17 G PO PACK: 17.0000 g | PACK | Freq: Every day | ORAL | Status: DC | PRN

## 2016-05-17 MED ORDER — CEFAZOLIN SODIUM-DEXTROSE 2-4 GM/100ML-% IV SOLN
2.0000 g | Freq: Three times a day (TID) | INTRAVENOUS | Status: AC
  Administered 2016-05-17 – 2016-05-18 (×2): 2 g via INTRAVENOUS
  Filled 2016-05-17: qty 100

## 2016-05-17 MED ORDER — DEXAMETHASONE SODIUM PHOSPHATE 10 MG/ML IJ SOLN
INTRAMUSCULAR | Status: AC
  Filled 2016-05-17: qty 1

## 2016-05-17 MED ORDER — HYDROMORPHONE HCL 2 MG/ML IJ SOLN
INTRAMUSCULAR | Status: AC
  Filled 2016-05-17: qty 1

## 2016-05-17 MED ORDER — ACETAMINOPHEN 325 MG PO TABS: 650.0000 mg | ORAL_TABLET | ORAL | Status: DC | PRN

## 2016-05-17 MED ORDER — HYDROMORPHONE HCL 2 MG/ML IJ SOLN: 0.5000 mg | INTRAMUSCULAR | Status: DC | PRN

## 2016-05-17 MED ORDER — EPHEDRINE SULFATE 50 MG/ML IJ SOLN
INTRAMUSCULAR | Status: DC | PRN
  Administered 2016-05-17 (×4): 10 mg via INTRAVENOUS

## 2016-05-17 MED ORDER — PROPOFOL 10 MG/ML IV BOLUS
INTRAVENOUS | Status: DC | PRN
  Administered 2016-05-17: 160 mg via INTRAVENOUS

## 2016-05-17 MED ORDER — LIDOCAINE-EPINEPHRINE (PF) 1 %-1:200000 IJ SOLN
INTRAMUSCULAR | Status: AC
  Filled 2016-05-17: qty 30

## 2016-05-17 MED ORDER — GABAPENTIN 300 MG PO CAPS
300.0000 mg | ORAL_CAPSULE | Freq: Three times a day (TID) | ORAL | Status: DC
  Administered 2016-05-17 – 2016-05-18 (×3): 300 mg via ORAL
  Filled 2016-05-17 (×3): qty 1

## 2016-05-17 MED ORDER — MIDAZOLAM HCL 2 MG/2ML IJ SOLN
INTRAMUSCULAR | Status: AC
  Filled 2016-05-17: qty 2

## 2016-05-17 MED ORDER — ONDANSETRON HCL 4 MG/2ML IJ SOLN
INTRAMUSCULAR | Status: AC
  Filled 2016-05-17: qty 2

## 2016-05-17 MED ORDER — LACTATED RINGERS IV SOLN
INTRAVENOUS | Status: DC
  Administered 2016-05-17: 1000 mL via INTRAVENOUS
  Administered 2016-05-17: 11:00:00 via INTRAVENOUS

## 2016-05-17 MED ORDER — SODIUM CHLORIDE 0.9 % IR SOLN
Status: DC | PRN
  Administered 2016-05-17: 500 mL

## 2016-05-17 MED ORDER — MIDAZOLAM HCL 5 MG/5ML IJ SOLN
INTRAMUSCULAR | Status: DC | PRN
  Administered 2016-05-17: 2 mg via INTRAVENOUS

## 2016-05-17 MED ORDER — ACETAMINOPHEN 650 MG RE SUPP: 650.0000 mg | RECTAL | Status: DC | PRN

## 2016-05-17 MED ORDER — DOCUSATE SODIUM 100 MG PO CAPS: 100.0000 mg | ORAL_CAPSULE | Freq: Two times a day (BID) | ORAL | 1 refills | Status: DC | PRN

## 2016-05-17 MED ORDER — ALUM & MAG HYDROXIDE-SIMETH 200-200-20 MG/5ML PO SUSP: 30.0000 mL | Freq: Four times a day (QID) | ORAL | Status: DC | PRN

## 2016-05-17 MED ORDER — ROCURONIUM BROMIDE 50 MG/5ML IV SOSY
PREFILLED_SYRINGE | INTRAVENOUS | Status: DC | PRN
  Administered 2016-05-17: 50 mg via INTRAVENOUS
  Administered 2016-05-17: 10 mg via INTRAVENOUS

## 2016-05-17 MED ORDER — LIDOCAINE-EPINEPHRINE (PF) 1 %-1:200000 IJ SOLN
INTRAMUSCULAR | Status: DC | PRN
  Administered 2016-05-17: 10 mL

## 2016-05-17 SURGICAL SUPPLY — 47 items
BAG ZIPLOCK 12X15 (MISCELLANEOUS) IMPLANT
CLEANER TIP ELECTROSURG 2X2 (MISCELLANEOUS) ×2 IMPLANT
CLOTH 2% CHLOROHEXIDINE 3PK (PERSONAL CARE ITEMS) ×2 IMPLANT
DRAPE MICROSCOPE LEICA (MISCELLANEOUS) ×2 IMPLANT
DRAPE SHEET LG 3/4 BI-LAMINATE (DRAPES) IMPLANT
DRAPE SURG 17X11 SM STRL (DRAPES) ×2 IMPLANT
DRAPE UTILITY XL STRL (DRAPES) ×2 IMPLANT
DRSG AQUACEL AG ADV 3.5X 4 (GAUZE/BANDAGES/DRESSINGS) ×2 IMPLANT
DRSG AQUACEL AG ADV 3.5X 6 (GAUZE/BANDAGES/DRESSINGS) IMPLANT
DURAPREP 26ML APPLICATOR (WOUND CARE) ×2 IMPLANT
DURASEAL SPINE SEALANT 3ML (MISCELLANEOUS) IMPLANT
ELECT BLADE TIP CTD 4 INCH (ELECTRODE) IMPLANT
ELECT REM PT RETURN 9FT ADLT (ELECTROSURGICAL) ×2
ELECTRODE REM PT RTRN 9FT ADLT (ELECTROSURGICAL) ×1 IMPLANT
GLOVE BIOGEL PI IND STRL 7.0 (GLOVE) ×1 IMPLANT
GLOVE BIOGEL PI INDICATOR 7.0 (GLOVE) ×1
GLOVE SURG SS PI 7.0 STRL IVOR (GLOVE) ×2 IMPLANT
GLOVE SURG SS PI 8.0 STRL IVOR (GLOVE) ×4 IMPLANT
GOWN STRL REUS W/TWL XL LVL3 (GOWN DISPOSABLE) ×4 IMPLANT
HEMOSTAT SPONGE AVITENE ULTRA (HEMOSTASIS) IMPLANT
IV CATH 14GX2 1/4 (CATHETERS) IMPLANT
KIT BASIN OR (CUSTOM PROCEDURE TRAY) ×2 IMPLANT
KIT POSITIONING SURG ANDREWS (MISCELLANEOUS) ×2 IMPLANT
MANIFOLD NEPTUNE II (INSTRUMENTS) ×2 IMPLANT
MARKER SKIN DUAL TIP RULER LAB (MISCELLANEOUS) ×2 IMPLANT
NEEDLE SPNL 18GX3.5 QUINCKE PK (NEEDLE) ×4 IMPLANT
PACK LAMINECTOMY ORTHO (CUSTOM PROCEDURE TRAY) ×2 IMPLANT
PATTIES SURGICAL .5 X.5 (GAUZE/BANDAGES/DRESSINGS) ×2 IMPLANT
PATTIES SURGICAL .75X.75 (GAUZE/BANDAGES/DRESSINGS) ×2 IMPLANT
PATTIES SURGICAL 1X1 (DISPOSABLE) IMPLANT
RUBBERBAND STERILE (MISCELLANEOUS) ×2 IMPLANT
SPONGE LAP 4X18 X RAY DECT (DISPOSABLE) IMPLANT
SPONGE SURGIFOAM ABS GEL 100 (HEMOSTASIS) ×2 IMPLANT
STAPLER VISISTAT (STAPLE) IMPLANT
STRIP CLOSURE SKIN 1/2X4 (GAUZE/BANDAGES/DRESSINGS) ×2 IMPLANT
SUT NURALON 4 0 TR CR/8 (SUTURE) IMPLANT
SUT PROLENE 3 0 PS 2 (SUTURE) IMPLANT
SUT VIC AB 1 CT1 27 (SUTURE)
SUT VIC AB 1 CT1 27XBRD ANTBC (SUTURE) IMPLANT
SUT VIC AB 1-0 CT2 27 (SUTURE) IMPLANT
SUT VIC AB 2-0 CT1 27 (SUTURE)
SUT VIC AB 2-0 CT1 TAPERPNT 27 (SUTURE) IMPLANT
SUT VIC AB 2-0 CT2 27 (SUTURE) IMPLANT
SYR 3ML LL SCALE MARK (SYRINGE) IMPLANT
TOWEL OR 17X26 10 PK STRL BLUE (TOWEL DISPOSABLE) ×2 IMPLANT
TOWEL OR NON WOVEN STRL DISP B (DISPOSABLE) ×2 IMPLANT
YANKAUER SUCT BULB TIP NO VENT (SUCTIONS) ×2 IMPLANT

## 2016-05-17 NOTE — Brief Op Note (Signed)
05/17/2016  11:59 AM  PATIENT:  Kirk Mueller  55 y.o. male  PRE-OPERATIVE DIAGNOSIS:  HERNIATED NUCLEUS PULPOSA, L4-5  POST-OPERATIVE DIAGNOSIS:  HERNIATED NUCLEUS PULPOSA, L4-5  PROCEDURE:  Procedure(s): MICRO LUMBER DECOMPRESSION L4-5, L5-S1 (N/A)  SURGEON:  Surgeon(s) and Role:    * Jene EveryJeffrey Kingjames Coury, MD - Primary  PHYSICIAN ASSISTANT:   ASSISTANTS: Bissell   ANESTHESIA:   general  EBL:  Total I/O In: 1000 [I.V.:1000] Out: 50 [Blood:50]  BLOOD ADMINISTERED:none  DRAINS: none   LOCAL MEDICATIONS USED:  LIDOCAINE   SPECIMEN:  Source of Specimen:  disc  DISPOSITION OF SPECIMEN:  PATHOLOGY  COUNTS:  YES  TOURNIQUET:  * No tourniquets in log *  DICTATION: .Other Dictation: Dictation Number [id number]  PLAN OF CARE: Admit for overnight observation  PATIENT DISPOSITION:  PACU - hemodynamically stable.   Delay start of Pharmacological VTE agent (>24hrs) due to surgical blood loss or risk of bleeding: yes

## 2016-05-17 NOTE — Anesthesia Procedure Notes (Addendum)
Procedure Name: Intubation Date/Time: 05/17/2016 10:47 AM Performed by: Wynonia SoursWALKER, Glorene Leitzke L Pre-anesthesia Checklist: Patient identified, Emergency Drugs available, Suction available, Patient being monitored and Timeout performed Patient Re-evaluated:Patient Re-evaluated prior to inductionOxygen Delivery Method: Circle system utilized Preoxygenation: Pre-oxygenation with 100% oxygen Intubation Type: IV induction and Cricoid Pressure applied Ventilation: Mask ventilation without difficulty Laryngoscope Size: Mac and 4 Grade View: Grade I Tube type: Oral Tube size: 7.5 mm Number of attempts: 1 Airway Equipment and Method: Stylet Placement Confirmation: ETT inserted through vocal cords under direct vision,  positive ETCO2,  CO2 detector and breath sounds checked- equal and bilateral Secured at: 23 cm Tube secured with: Tape Dental Injury: Teeth and Oropharynx as per pre-operative assessment  Comments: DL x 1. Head and neck maintained in alignment with DL.

## 2016-05-17 NOTE — Interval H&P Note (Signed)
History and Physical Interval Note:  05/17/2016 9:50 AM  Kirk Mueller  has presented today for surgery, with the diagnosis of HNP, L4-5  The various methods of treatment have been discussed with the patient and family. After consideration of risks, benefits and other options for treatment, the patient has consented to  Procedure(s): MICRO LUMBER DECOMPRESSION L4-5,POSSIBLE L5-S1 (N/A) as a surgical intervention .  The patient's history has been reviewed, patient examined, no change in status, stable for surgery.  I have reviewed the patient's chart and labs.  Questions were answered to the patient's satisfaction.     [name]

## 2016-05-17 NOTE — Anesthesia Preprocedure Evaluation (Signed)
Anesthesia Evaluation  Patient identified by MRN, date of birth, ID band Patient awake    History of Anesthesia Complications (+) Emergence DeliriumNegative for: history of anesthetic complications  Airway Mallampati: II  TM Distance: >3 FB Neck ROM: Full    Dental  (+) Teeth Intact, Dental Advisory Given,    Pulmonary neg pulmonary ROS,    breath sounds clear to auscultation       Cardiovascular hypertension, + Valvular Problems/Murmurs  Rhythm:Regular Rate:Normal     Neuro/Psych    GI/Hepatic Neg liver ROS, GERD  ,  Endo/Other  negative endocrine ROS  Renal/GU negative Renal ROS     Musculoskeletal   Abdominal   Peds  Hematology   Anesthesia Other Findings   Reproductive/Obstetrics                             Anesthesia Physical Anesthesia Plan  ASA: II  Anesthesia Plan: General   Post-op Pain Management:    Induction: Intravenous  Airway Management Planned: Oral ETT  Additional Equipment:   Intra-op Plan:   Post-operative Plan: Extubation in OR  Informed Consent: I have reviewed the patients History and Physical, chart, labs and discussed the procedure including the risks, benefits and alternatives for the proposed anesthesia with the patient or authorized representative who has indicated his/her understanding and acceptance.   Dental advisory given  Plan Discussed with: CRNA  Anesthesia Plan Comments:         Anesthesia Quick Evaluation

## 2016-05-17 NOTE — Anesthesia Postprocedure Evaluation (Signed)
Anesthesia Post Note  Patient: Patterson HammersmithJohn H Seith  Procedure(s) Performed: Procedure(s) (LRB): MICRO LUMBER DECOMPRESSION L4-5, L5-S1 (N/A)  Patient location during evaluation: PACU Anesthesia Type: General Level of consciousness: awake and alert Pain management: pain level controlled Vital Signs Assessment: post-procedure vital signs reviewed and stable Respiratory status: spontaneous breathing, nonlabored ventilation, respiratory function stable and patient connected to nasal cannula oxygen Cardiovascular status: blood pressure returned to baseline and stable Postop Assessment: no signs of nausea or vomiting Anesthetic complications: no    Last Vitals:  Vitals:   05/17/16 1330 05/17/16 1356  BP: (!) 140/94 130/87  Pulse: 77 87  Resp: 10 14  Temp: 36.7 C 36.5 C    Last Pain:  Vitals:   05/17/16 1330  TempSrc:   PainSc: 3                  Benito Lemmerman,JAMES TERRILL

## 2016-05-17 NOTE — Transfer of Care (Signed)
Immediate Anesthesia Transfer of Care Note  Patient: Kirk HammersmithJohn H Mueller  Procedure(s) Performed: Procedure(s): MICRO LUMBER DECOMPRESSION L4-5, L5-S1 (N/A)  Patient Location: PACU  Anesthesia Type:General  Level of Consciousness:  sedated, patient cooperative and responds to stimulation  Airway & Oxygen Therapy:Patient Spontanous Breathing and Patient connected to face mask oxgen  Post-op Assessment:  Report given to PACU RN and Post -op Vital signs reviewed and stable  Post vital signs:  Reviewed and stable  Last Vitals:  Vitals:   05/17/16 0904 05/17/16 1225  BP: (!) 131/95 (!) 141/92  Pulse: 66 90  Resp: 16 16  Temp: 36.4 C 36.8 C    Complications: No apparent anesthesia complications

## 2016-05-17 NOTE — Discharge Instructions (Signed)

## 2016-05-17 NOTE — H&P (View-Only) (Signed)
Kirk Mueller is an 55 y.o. male.   Chief Complaint: RLE radicular pain HPI: The patient is a 55 year old male who presents with back pain. The patient reports low back symptoms including pain which began 4 month(s) ago without any known injury. Symptoms are reported to be located in the low back and Symptoms include pain, numbness and weakness (RLE). The pain radiates to the right buttock, right lateral thigh and right lateral lower leg. The patient describes the pain as sharp. The patient describes the severity of their symptoms as severe. Symptoms are exacerbated by sitting. Current treatment includes non-opioid analgesics (Tramadol) and oral corticosteroids. Prior to being seen today the patient was previously evaluated by Dr. Barnes and Dr. Ramos. Past evaluation has included x-ray of the lumbar spine and MRI of the lumbar spine. Past treatment has included non-opioid analgesics, opioid analgesics, corticosteroids and physical therapy.  Reports persistent right lower extremity radicular pain, worse with activity, better with rest. He has had this for close to over four months. He has had physical therapy, activity modification, epidural at L4-5. He has reported even longer than that. 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 Disc degeneration is noted at L5-S1 to the left and small disc protrusion. It will radiate down into the ankle and outside of the ankle. Minimal back pain.  He did physical therapy as well to no avail.  He has also gone to the Emergency Room.  Past Medical History:  Diagnosis Date  . Hypercholesteremia   . Hypertension     Past Surgical History:  Procedure Laterality Date  . brown recluse bite    . COLONOSCOPY    . KNEE ARTHROSCOPY  right  . left leg surgery      No family history on file. Social History:  reports that he has never smoked. He does not have any smokeless  tobacco history on file. He reports that he does not drink alcohol or use drugs.  Allergies: No Known Allergies   (Not in a hospital admission)  No results found for this or any previous visit (from the past 48 hour(s)). No results found.  Review of Systems  Constitutional: Negative.   HENT: Negative.   Eyes: Negative.   Respiratory: Negative.   Cardiovascular: Negative.   Gastrointestinal: Negative.   Genitourinary: Negative.   Musculoskeletal: Positive for back pain.  Skin: Negative.   Neurological: Positive for sensory change and focal weakness.  Psychiatric/Behavioral: Negative.     There were no vitals taken for this visit. Physical Exam  Constitutional: He is oriented to person, place, and time. He appears well-developed. He appears distressed.  HENT:  Head: Normocephalic.  Eyes: Pupils are equal, round, and reactive to light.  Neck: Normal range of motion.  Cardiovascular: Normal rate.   Respiratory: Effort normal.  GI: Soft.  Musculoskeletal:  Severe distress. Standing. Walks with an antalgic gait. Mood and affect his appropriate. Straight leg raise, buttock, thigh, and calf pain on the right, negative on the left. EHLs 4+/5 on the right compared to the left, slightly diminished repetitive plantarflexion. Slightly diminished Achilles reflex on the right. Lumbar spine exam reveals no evidence of soft tissue swelling, deformity or skin ecchymosis. On palpation there is no tenderness of the lumbar spine. No flank pain with percussion. The abdomen is soft and nontender. Nontender over the trochanters. No cellulitis or lymphadenopathy.    Good range of motion of the lumbar spine without associated pain. Straight leg raise is negative. Motor is 5/5 including EHL, tibialis anterior, plantar flexion, quadriceps and hamstrings. Patient is normoreflexic. There is no Babinski or clonus. Sensory exam is intact to light touch. Patient has good distal pulses. No DVT. No pain and normal  range of motion without instability of the hips, knees and ankles.  Neurological: He is alert and oriented to person, place, and time.    Three-view radiographs, AP, lateral, flexion and extension demonstrate disc space narrowing at L4-5 and L5-S1. Mo instability in flexion or extension. No pars defect. Mild osteoarthrosis of the hips.  MRI demonstrates a caudal displacement of the L4-5 disc down into the foramen of L5, near the foramen of L5.  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  We discussed options. He has failed conservative treatment and presence of neurologic deficit. We discussed proceeding with a micro lumbar decompression.  I had an extensive discussion of the risks and benefits of the lumbar decompression with the patient including bleeding, infection, damage to neurovascular structures, epidural fibrosis, CSF leak requiring repair. We also discussed increase in pain, adjacent segment disease, recurrent disc herniation, need for future surgery including repeat decompression and/or fusion. We also discussed risks of postoperative hematoma, paralysis, anesthetic complications including DVT, PE, death, cardiopulmonary dysfunction. In addition, the perioperative and postoperative courses were discussed in detail including the rehabilitative time and return to functional activity and work. I provided the patient with an illustrated handout and utilized the appropriate surgical models.  It indicate though that we will most likely require entering through two interlaminar windows given that it is caudally extruded beneath the L5 lamina. He does have some S1 symptomatology. I will proceed as soon as possible and to have that scheduled. I appreciate the kind referral by Dr. Ethelene Halamos. If he has any worsening in the interim, he is to call. We discussed activity modification and postoperative course in  detail. I placed him on gabapentin.  Plan microlumbar decompression L4-5, possible L5-S1  Dorothy SparkBISSELL, JACLYN M., PA-C for Dr. Shelle IronBeane 05/08/2016, 2:11 PM

## 2016-05-18 DIAGNOSIS — M5126 Other intervertebral disc displacement, lumbar region: Secondary | ICD-10-CM | POA: Diagnosis not present

## 2016-05-18 MED ORDER — CELECOXIB 200 MG PO CAPS: 200.0000 mg | ORAL_CAPSULE | Freq: Two times a day (BID) | ORAL | Status: DC | PRN

## 2016-05-18 NOTE — Evaluation (Signed)
Occupational Therapy Evaluation Patient Details Name: Kirk HammersmithJohn H Mueller MRN: [id number] DOB: 03/24/1961 Today's Date: 05/18/2016    History of Present Illness MICRO LUMBER DECOMPRESSION L4-5, L5-S1    Clinical Impression   OT education complete regarding ADL activity and back precautions   Follow Up Recommendations  No OT follow up    Equipment Recommendations  None recommended by OT    Recommendations for Other Services       Precautions / Restrictions Precautions Precautions: Back      Mobility Bed Mobility               General bed mobility comments: pt in chair  Transfers Overall transfer level: Modified independent               General transfer comment: pt mod I with VC for instruction regarding back precautions and fxal tasks         ADL Overall ADL's : At baseline                                       General ADL Comments: pt overall at baseline with OT instruction regarding aDL activity and back precautions.  Pt able to demonstrate the ability to get dressed, get in and out of the bathroom as well as functional mobiility.                 Pertinent Vitals/Pain Pain Assessment: 0-10 Pain Score: 3  Pain Location: back Pain Descriptors / Indicators: Sore Pain Intervention(s): Monitored during session     Hand Dominance     Extremity/Trunk Assessment Upper Extremity Assessment Upper Extremity Assessment: Overall WFL for tasks assessed           Communication Communication Communication: No difficulties   Cognition Arousal/Alertness: Awake/alert Behavior During Therapy: WFL for tasks assessed/performed Overall Cognitive Status: Within Functional Limits for tasks assessed                                Home Living Family/patient expects to be discharged to:: Private residence Living Arrangements: Spouse/significant other Available Help at Discharge: Family Type of Home: House       Home  Layout: Multi-level     Bathroom Shower/Tub: Producer, television/film/videoWalk-in shower   Bathroom Toilet: Standard     Home Equipment: None          Prior Functioning/Environment Level of Independence: Independent                       OT Goals(Current goals can be found in the care plan section) Acute Rehab OT Goals Patient Stated Goal: home later today OT Goal Formulation: With patient  OT Frequency:                End of Session Nurse Communication: Mobility status  Activity Tolerance: Patient tolerated treatment well Patient left: in chair;with call bell/phone within reach   Time: 0933-0949 OT Time Calculation (min): 16 min Charges:  OT General Charges $OT Visit: 1 Procedure OT Evaluation $OT Eval Low Complexity: 1 Procedure G-Codes: OT G-codes **NOT FOR INPATIENT CLASS** Functional Assessment Tool Used: clinical observation Functional Limitation: Self care Self Care Current Status ([id number](G8987): At least 1 percent but less than 20 percent impaired, limited or restricted Self Care Goal Status ([id number](G8988): 0 percent impaired, limited or restricted Self Care Discharge  Status [phone]([phone]): At least 1 percent but less than 20 percent impaired, limited or restricted  Kirk Mueller, Kirk Mueller 05/18/2016, 10:12 AM

## 2016-05-18 NOTE — Progress Notes (Signed)
Subjective: 1 Day Post-Op Procedure(s) (LRB): MICRO LUMBER DECOMPRESSION L4-5, L5-S1 (N/A) Patient reports pain as mild.  Pain well controlled. Reports incisional pain controlled by pain meds. No other c/o. Feels ready to go home today. Seen by Dr. Shelle IronBeane  Objective: Vital signs in last 24 hours: Temp:  [97.7 F (36.5 C)-98.9 F (37.2 C)] 98.9 F (37.2 C) (12/14 0508) Pulse Rate:  [77-100] 86 (12/14 0508) Resp:  [10-19] 16 (12/14 0508) BP: (112-153)/(77-101) 128/77 (12/14 0508) SpO2:  [93 %-98 %] 95 % (12/14 0508)  Intake/Output from previous day: 12/13 0701 - 12/14 0700 In: 3622.5 [P.O.:900; I.V.:2622.5; IV Piggyback:100] Out: 1775 [Urine:1725; Blood:50] Intake/Output this shift: Total I/O In: -  Out: 375 [Urine:375]  No results for input(s): HGB in the last 72 hours. No results for input(s): WBC, RBC, HCT, PLT in the last 72 hours. No results for input(s): NA, K, CL, CO2, BUN, CREATININE, GLUCOSE, CALCIUM in the last 72 hours. No results for input(s): LABPT, INR in the last 72 hours.  Neurologically intact ABD soft Neurovascular intact Sensation intact distally Intact pulses distally Dorsiflexion/Plantar flexion intact Incision: dressing C/D/I and no drainage No cellulitis present Compartment soft no calf pain or sign of DVT  Assessment/Plan: 1 Day Post-Op Procedure(s) (LRB): MICRO LUMBER DECOMPRESSION L4-5, L5-S1 (N/A) Advance diet Up with therapy D/C IV fluids  Discussed D/C instructions, dressing instructions, Lspine precautions, post-op protocols and time out of work Follow up in office 10-14 days post-op for suture removal D/C home today  Alvena Kiernan M. 05/18/2016, 10:39 AM

## 2016-05-18 NOTE — Evaluation (Signed)
Physical Therapy Evaluation Patient Details Name: Kirk HammersmithJohn H Mueller MRN: [id number] DOB: 04/25/1961 Today's Date: 05/18/2016   History of Present Illness  MICRO LUMBER DECOMPRESSION L4-5, L5-S1   Clinical Impression  Pt s/p back surgery and presenting with functional mobility limitations 2* post op pain and back precautions.  Pt mobilizing at Sup/Ind level and plans dc home with family assist.    Follow Up Recommendations No PT follow up    Equipment Recommendations  None recommended by PT    Recommendations for Other Services OT consult     Precautions / Restrictions Precautions Precautions: Back Precaution Booklet Issued: Yes (comment) Restrictions Weight Bearing Restrictions: No      Mobility  Bed Mobility Overal bed mobility: Needs Assistance Bed Mobility: Supine to Sit     Supine to sit: Supervision     General bed mobility comments: cues for sequence and correct log roll technique  Transfers Overall transfer level: Needs assistance Equipment used: None Transfers: Sit to/from Stand Sit to Stand: Min guard;Supervision         General transfer comment: cues for transition position, adherence to back precautions and use of UEs to self assist  Ambulation/Gait Ambulation/Gait assistance: Min guard;Supervision;Independent Ambulation Distance (Feet): 400 Feet Assistive device: None Gait Pattern/deviations: Step-through pattern;Decreased step length - right;Decreased step length - left;Shuffle;Wide base of support Gait velocity: decr Gait velocity interpretation: Below normal speed for age/gender General Gait Details: Good saftey awareness and stability  Stairs Stairs: Yes Stairs assistance: Supervision Stair Management: One rail Right Number of Stairs: 4 General stair comments: cues for sequence  Wheelchair Mobility    Modified Rankin (Stroke Patients Only)       Balance                                             Pertinent  Vitals/Pain Pain Assessment: 0-10 Pain Score: 4  Pain Location: back Pain Descriptors / Indicators: Sore Pain Intervention(s): Limited activity within patient's tolerance;Monitored during session;Premedicated before session    Home Living Family/patient expects to be discharged to:: Private residence Living Arrangements: Spouse/significant other Available Help at Discharge: Family Type of Home: House Home Access: Stairs to enter   Secretary/administratorntrance Stairs-Number of Steps: 1 Home Layout: Multi-level Home Equipment: None      Prior Function Level of Independence: Independent               Hand Dominance        Extremity/Trunk Assessment   Upper Extremity Assessment Upper Extremity Assessment: Overall WFL for tasks assessed    Lower Extremity Assessment Lower Extremity Assessment: Overall WFL for tasks assessed       Communication   Communication: No difficulties  Cognition Arousal/Alertness: Awake/alert Behavior During Therapy: WFL for tasks assessed/performed Overall Cognitive Status: Within Functional Limits for tasks assessed                      General Comments      Exercises     Assessment/Plan    PT Assessment Patient needs continued PT services  PT Problem List Decreased strength;Decreased range of motion;Decreased activity tolerance;Decreased mobility;Decreased knowledge of use of DME;Pain          PT Treatment Interventions Gait training;Stair training;Functional mobility training;Patient/family education    PT Goals (Current goals can be found in the Care Plan section)  Acute Rehab PT Goals Patient  Stated Goal: home later today PT Goal Formulation: All assessment and education complete, DC therapy    Frequency Min 1X/week   Barriers to discharge        Co-evaluation               End of Session   Activity Tolerance: Patient tolerated treatment well Patient left: in chair;with call bell/phone within reach Nurse  Communication: Mobility status    Functional Assessment Tool Used: Clinical judgement Functional Limitation: Mobility: Walking and moving around Mobility: Walking and Moving Around Current Status ([id number](G8978): At least 1 percent but less than 20 percent impaired, limited or restricted Mobility: Walking and Moving Around Goal Status [phone]([phone]): At least 1 percent but less than 20 percent impaired, limited or restricted Mobility: Walking and Moving Around Discharge Status [phone]): At least 1 percent but less than 20 percent impaired, limited or restricted    Time: 0855-0926 PT Time Calculation (min) (ACUTE ONLY): 31 min   Charges:   PT Evaluation $PT Eval Low Complexity: 1 Procedure PT Treatments $Gait Training: 8-22 mins   PT G Codes:   PT G-Codes **NOT FOR INPATIENT CLASS** Functional Assessment Tool Used: Clinical judgement Functional Limitation: Mobility: Walking and moving around Mobility: Walking and Moving Around Current Status ([phone]([id number]): At least 1 percent but less than 20 percent impaired, limited or restricted Mobility: Walking and Moving Around Goal Status [phone]): At least 1 percent but less than 20 percent impaired, limited or restricted Mobility: Walking and Moving Around Discharge Status [phone]([phone]): At least 1 percent but less than 20 percent impaired, limited or restricted    Kirk Mueller 05/18/2016, 12:20 PM

## 2016-05-18 NOTE — Op Note (Signed)
NAME:  Drexel IhaDEANE, Lior                  ACCOUNT NO.:  [id number]  MEDICAL RECORD NO.:  [id number]  LOCATION:                                 FACILITY:  PHYSICIAN:  Jene EveryJeffrey Marvetta Vohs, M.D.         DATE OF BIRTH:  DATE OF PROCEDURE:  05/17/2016 DATE OF DISCHARGE:  05/18/2016                              OPERATIVE REPORT   PREOPERATIVE DIAGNOSIS:  Herniated nucleus pulposus and spinal stenosis, L4-5.  POSTOPERATIVE DIAGNOSIS:  Herniated nucleus pulposus and spinal stenosis, L4-5; stenosis, L5-S1.  PROCEDURES PERFORMED: 1. Microlumbar decompression at L4-5, L5-S1. 2. Hemilaminectomy of L5. 3. Foraminotomies, L4-L5. 4. Microdiskectomy, 4-5.  ANESTHESIA:  General.  ASSIST:  Lanna PocheJacqueline Bissell, PA.  HISTORY:  55 year old male with right lower extremity radicular pain, myotomal weakness, dermatomal dysesthesias secondary extruded fragment, L5-S1 migrating caudad, compressing L5 root, associated stenosis indicated for retrieval of the extruded fragment compressing the 5 root. We discussed lumbar decompression.  Microlumbar decompression, L4-5, possible 5-1 as the fragment that migrated caudad beneath the hemi lamina where he had facet tropism, small interlaminar window.  It may require removal of hemi lamina or approaching it from the L5-S1 space. The risks and benefits discussed including bleeding, infection, damage to neurovascular structure DVT, PE, anesthetic complications, etc.  TECHNIQUE:  With the patient in supine position, after induction of adequate general anesthesia, 2 g Kefzol, placed prone on the [place] frame.  All bony prominences were well padded.  Lumbar region was prepped and draped in usual sterile fashion.  Two 18-gauge spinal needle were utilized to localize L4-5 interspace, confirmed with x-ray. Incision was made from the spinous process of 4 down to 5-1. Subcutaneous tissue was dissected.  Electrocautery was utilized to achieve hemostasis.  Dorsolumbar fascia was  infiltrated with 0.25% lidocaine with epinephrine.  Paraspinous muscles divided and McCullough retractors placed.  Operating microscope was draped and brought on the surgical field after we confirmed the space 4-5.  A near absent interlaminar space at 4-5 was noted with calcification of ligamentum. There was a shingle near vertical hemi lamina of 5.  With the intraoperative radiographs and the marker from the [facility], the disk fragment was beneath the L5 lamina.  I detached ligamentum flavum from the cephalad edge of 5 and the caudad edge of 5.  I removed the portion of the hemi lamina and cutting osteophyte rongeur.  It felt it was best removed the hemi lamina to gain access to the disk herniation and preserve the 5 root.  I detached the ligamentum flavum from the caudad edge of 5 using 2 mm Kerrison, removed the hemi lamina of 5.  Right under which was enlarged L5 nerve root, I performed a generous foraminotomy of 5 and then gently mobilized the 5 root medially decompressed lateral recess of the medial border of pedicle.  With neuro patties beneath the ligamentum flavum cephalad and to protect the dura, performed hemilaminotomy of the caudad edge of 4.  Mobilizing the 5 root and tracking it cephalad to the disk space noted inferior to the disk space was extruded fragment, consistent with that seen on the MRI.  This was retrieved and sent to Pathology.  This with a micropituitary was tracked back to the disk space.  There was no disk herniation noted. This was the true extruded fragment from the disk space.  No significant annular rent.  A neural probe passed freely up the foramen of 4 and 5, we performed foraminotomy of 4.  The superior articulating process of 5 contributing to stenosis of lateral recess at 4-5.  I checked beneath thecal sac, the shoulder of the L5 root, the axilla of the 5 root and at the foramen of L5 and down at S1.  Ligamentum flavum partially removed from the  interspace at S1.  There was a fairly increased lumbosacral angle as well contributing to the overall compression of the 5 root out into the foramen of 5 below the hemi lamina and anterior to the pedicle of 5.  Following this, a neural probe passed freely at the foramen of 4- 5 and S1 without difficulty.  Though the 5 root was erythematous and edematous, 1 cm of excursion of the medial pedicle without tension. Copiously irrigated the space.  No evidence of active bleeding.  We obtained a confirmatory radiograph at 4-5 in the probe at the foramen of 5.  Decompressed some of the hemi lamina of 5 as well and inferior process of 5 and again access to the exiting 5 root.  After this, I felt we addressed the pathology, removed the [facility] retractor, paraspinous muscles inspected.  No evidence of active bleeding.  We removed the dorsolumbar fascia.  I removed the [facility], closed the dorsolumbar fascia with 1 Vicryl, subcu with 2-0, and skin with Prolene. Sterile dressing applied.  Placed supine on the hospital bed, extubated without difficulty, and transported to the recovery room in satisfactory condition.  The patient tolerated the procedure well.  No complications.  Assistant, Lanna PocheJacqueline Bissell, [place].  Minimal blood loss.  Lanna PocheJacqueline Bissell, PA was used throughout the case for the patient positioning, gentle intermittent neural traction, and closure.     Jene EveryJeffrey Shacarra Choe, M.D.     Cordelia PenJB/MEDQ  D:  05/17/2016  T:  05/18/2016  Job:  [id number]

## 2016-05-18 NOTE — Discharge Summary (Signed)
Physician Discharge Summary   Patient ID: Kirk Mueller MRN: [id number] DOB/AGE: 1960/09/03 55 y.o.  Admit date: 05/17/2016 Discharge date: 05/18/2016  Primary Diagnosis:   HERNIATED NUCLEUS PULPOSA, L4-5  Admission Diagnoses:  Past Medical History:  Diagnosis Date  . Arthritis   . Complication of anesthesia    headache, very active when came out of anesthesia   . GERD (gastroesophageal reflux disease)   . Headache   . Heart murmur    as a teenager   . Hypercholesteremia   . Hypertension    Discharge Diagnoses:   Principal Problem:   HNP (herniated nucleus pulposus), lumbar Active Problems:   Spinal stenosis of lumbar region  Procedure:  Procedure(s) (LRB): MICRO LUMBER DECOMPRESSION L4-5, L5-S1 (N/A)   Consults: None  HPI:  see H&P    Laboratory Data: Hospital Outpatient Visit on 05/12/2016  Component Date Value Ref Range Status  . Specimen Description 05/14/2016 NOSE   Final  . Special Requests 05/14/2016 NONE   Final  . Culture 05/14/2016    Final                   Value:NO MRSA DETECTED Performed at [facility]   . Report Status 05/14/2016 05/14/2016 FINAL   Final   No results for input(s): HGB in the last 72 hours. No results for input(s): WBC, RBC, HCT, PLT in the last 72 hours. No results for input(s): NA, K, CL, CO2, BUN, CREATININE, GLUCOSE, CALCIUM in the last 72 hours. No results for input(s): LABPT, INR in the last 72 hours.  X-Rays:Dg Lumbar Spine 2-3 Views  Addendum Date: 05/17/2016   ADDENDUM REPORT: 05/17/2016 07:52 ADDENDUM: These radiographs ir compared to the lumbar spine CT on 12/27/2015 and the lumbar MRI on 04/28 1,011. Lumbar segmentation is normal, as designated on the prior CT and MRI. CONCLUSION: Normal lumbar segmentation, corresponding to the same numbering system used on the prior lumbar CT and MRI. The lumbar levels on these images were numbered in anticipation of surgery. Electronically Signed   By: Odessa Fleming M.D.   On:  05/17/2016 07:52   Result Date: 05/17/2016 CLINICAL DATA:  Preoperative study. EXAM: LUMBAR SPINE - 2-3 VIEW COMPARISON:  December 27, 2015 CT scan FINDINGS: Mild degenerative changes. No fracture or traumatic malalignment. No acute abnormalities. IMPRESSION: Mild degenerative changes. Electronically Signed: By: Gerome Sam III M.D On: 05/12/2016 15:13   Dg Spine Portable 1 View  Result Date: 05/17/2016 CLINICAL DATA:  Lumbar surgery. EXAM: PORTABLE SPINE - 1 VIEW COMPARISON:  None. FINDINGS: Lumbar vertebra numbered as per prior exam. Metallic markers noted posteriorly along the superior and inferior endplates of L5. IMPRESSION: Metallic markers noted posteriorly as above . Electronically Signed   By: Maisie Fus  Register   On: 05/17/2016 11:59   Dg Spine Portable 1 View  Result Date: 05/17/2016 CLINICAL DATA:  Surgical level L4-5, possible L5-S1. EXAM: PORTABLE SPINE - 1 VIEW COMPARISON:  05/17/2016 FINDINGS: Posterior surgical instruments project over the L5 spinous process. IMPRESSION: Intraoperative localization as above. Electronically Signed   By: Charlett Nose M.D.   On: 05/17/2016 11:16   Dg Spine Portable 1 View  Result Date: 05/17/2016 CLINICAL DATA:  Intraoperative localization at L4-5 and L5-S1. EXAM: PORTABLE SPINE - 1 VIEW COMPARISON:  05/12/2016. FINDINGS: Single intraoperative cross-table lateral view of the lumbar spine is submitted. Numbering system utilized on 05/12/2016 is preserved. Surgical instrument tips project posterior to the L4-5 and L5-S1 interspinous regions. IMPRESSION: Intraoperative localization at L4-5  and L5-S1. Electronically Signed   By: Leanna BattlesMelinda  Blietz M.D.   On: 05/17/2016 11:08    EKG: Orders placed or performed during the hospital encounter of 05/12/16  . EKG 12-Lead  . EKG 12-Lead     Hospital Course: [facility] and taken to the OR and underwent the above state procedure without complications.  Patient tolerated the  procedure well and was later transferred to the recovery room and then to the orthopaedic floor for postoperative care.  They were given PO and IV analgesics for pain control following their surgery.  They were given 24 hours of postoperative antibiotics.   PT was consulted postop to assist with mobility and transfers.  The patient was allowed to be WBAT with therapy and was taught back precautions. Discharge planning was consulted to help with postop disposition and equipment needs.  Patient had a good night on the evening of surgery and started to get up OOB with therapy on day one. Patient was seen in rounds and was ready to go home on day one.  They were given discharge instructions and dressing directions.  They were instructed on when to follow up in the office with Dr. Shelle IronBeane.   Diet: Regular diet Activity:WBAT; Lspine precautions Follow-up:in 10-14 days Disposition - Home Discharged Condition: good   Discharge Instructions    Call MD / Call 911    Complete by:  As directed    If you experience chest pain or shortness of breath, CALL 911 and be transported to the hospital emergency room.  If you develope a fever above 101 F, pus (white drainage) or increased drainage or redness at the wound, or calf pain, call your surgeon's office.   Constipation Prevention    Complete by:  As directed    Drink plenty of fluids.  Prune juice may be helpful.  You may use a stool softener, such as Colace (over the counter) 100 mg twice a day.  Use MiraLax (over the counter) for constipation as needed.   Diet - low sodium heart healthy    Complete by:  As directed    Increase activity slowly as tolerated    Complete by:  As directed        Medication List    STOP taking these medications   rosuvastatin 5 MG tablet Commonly known as:  CRESTOR     TAKE these medications   acetaminophen 500 MG tablet Commonly known as:  TYLENOL Take 500 mg by mouth every 6 (six) hours as needed (for pain).     celecoxib 200 MG capsule Commonly known as:  CELEBREX Take 1 capsule (200 mg total) by mouth 2 (two) times daily as needed (for pain). May resume 5 days post-op What changed:  additional instructions   docusate sodium 100 MG capsule Commonly known as:  COLACE Take 1 capsule (100 mg total) by mouth 2 (two) times daily as needed for mild constipation.   gabapentin 300 MG capsule Commonly known as:  NEURONTIN Take 300 mg by mouth 3 (three) times daily.   methocarbamol 500 MG tablet Commonly known as:  ROBAXIN Take 1 tablet (500 mg total) by mouth every 6 (six) hours as needed for muscle spasms.   metoprolol tartrate 25 MG tablet Commonly known as:  LOPRESSOR Take 12.5 mg by mouth 2 (two) times daily.   oxyCODONE-acetaminophen 5-325 MG tablet Commonly known as:  PERCOCET Take 1-2 tablets by mouth every 4 (four) hours as needed for severe  pain.   polyethylene glycol packet Commonly known as:  MIRALAX / GLYCOLAX Take 17 g by mouth daily.      Follow-up Information    BEANE,JEFFREY C, MD Follow up in 2 week(s).   Specialty:  Orthopedic Surgery Contact information: [address] [place] [address] [phone]           Signed: Andrez GrimeJaclyn Bissell, PA-C Orthopaedic Surgery 05/18/2016, 10:41 AM

## 2016-05-18 NOTE — Progress Notes (Signed)
RN reviewed discharge instructions with patient and family. All questions answered.   Paperwork and prescriptions given.   NT rolled patient down with all belongings to family car. 

## 2016-05-18 NOTE — Care Management Note (Signed)
Case Management Note  Patient Details  Name: AMAURY KUZEL MRN: [id number] Date of Birth: Jul 07, 1960  Subjective/Objective:                  MICRO LUMBER DECOMPRESSION L4-5, L5-S1  Action/Plan: Discharge planning Expected Discharge Date:  05/18/16              Expected Discharge Plan:  Home/Self Care  In-House Referral:  NA  Discharge planning Services     Post Acute Care Choice:  NA Choice offered to:  Patient  DME Arranged:  N/A DME Agency:  NA  HH Arranged:  NA HH Agency:  NA  Status of Service:  Completed, signed off  If discussed at [place] of Stay Meetings, dates discussed:    Additional Comments: CM met with pt in room to discuss needs.  Pt denies need for any DME and is not receiving any HH services; No HH services have ben recc or ordered.  NO other Cm needs were communicated. Dellie Catholic, RN 05/18/2016, 1:05 PM

## 2016-11-22 ENCOUNTER — Telehealth: Payer: Self-pay | Admitting: Orthopedic Surgery

## 2016-11-22 NOTE — Telephone Encounter (Signed)
Patient called to inquire about records from past surgery, states done by Dr Romeo AppleHarrison around 2005 to 2006.  Told will review records, prior to Epic system.* * Located operative report from 09/09/03.  Called back to patient to notify, as patient will need to sign release.  Reached voice mail; left message.

## 2016-11-23 NOTE — Telephone Encounter (Signed)
Patient came to office, signed release; records request processed.

## 2017-04-16 ENCOUNTER — Encounter: Payer: Self-pay | Admitting: Podiatry

## 2017-04-16 ENCOUNTER — Ambulatory Visit (INDEPENDENT_AMBULATORY_CARE_PROVIDER_SITE_OTHER): Payer: Commercial Managed Care - PPO | Admitting: Podiatry

## 2017-04-16 ENCOUNTER — Other Ambulatory Visit: Payer: Self-pay | Admitting: Podiatry

## 2017-04-16 ENCOUNTER — Ambulatory Visit (INDEPENDENT_AMBULATORY_CARE_PROVIDER_SITE_OTHER): Payer: Commercial Managed Care - PPO

## 2017-04-16 VITALS — Resp 16 | Ht 67.0 in | Wt 198.0 lb

## 2017-04-16 DIAGNOSIS — M2021 Hallux rigidus, right foot: Secondary | ICD-10-CM

## 2017-04-16 DIAGNOSIS — M79671 Pain in right foot: Secondary | ICD-10-CM | POA: Diagnosis not present

## 2017-04-16 NOTE — Progress Notes (Signed)
Subjective:    Patient ID: Kirk Mueller, male   DOB: 56 y.o.   MRN: [id number]   HPI patient presents with a lot of pain in the big toe joint right and states that this is been hurting him for around 7 years. Patient did use and abuse his feet secondary to the [employer] and [employer] but does not remember specific injury and states it's been gradually getting worse over that time    Review of Systems  All other systems reviewed and are negative.       Objective:  Physical Exam  Constitutional: He appears well-developed and well-nourished.  Cardiovascular: Intact distal pulses.  Pulmonary/Chest: Effort normal.  Musculoskeletal: Normal range of motion.  Neurological: He is alert.  Skin: Skin is warm.  Nursing note and vitals reviewed.  neurovascular status found to be intact muscle strength was adequate range of motion within normal limits. Patient has significant spurring of the first metatarsal head right with reduced range of motion and pain upon palpation to the joint with fluid buildup and does have mild crepitus but did have about 15 of motion which was blocked by spurring. Left shows no abnormal motion     Assessment:   Hallux limitus rigidus condition right with inflammation fluid around the bone structure with pain      Plan:    H&P condition reviewed and at this time I went ahead and discussed the x-rays and surgical intervention that would be of benefit to this patient. Patient wants surgery and I explained to him the procedure and he will wait till January for surgery and I reviewed a osteotomy of the first metatarsal with possibility for Cartee the implant. Patient is scheduled to be seen back and was given all education at this time  X-rays indicate spurring around the first MPJ right with narrowing of the joint and healthy joint left with no spurring

## 2017-04-16 NOTE — Progress Notes (Signed)
   Subjective:    Patient ID: Kirk Mueller, male    DOB: 07/13/1960, 56 y.o.   MRN: [id number]  HPI    Review of Systems  Respiratory: Positive for cough and wheezing.   Musculoskeletal: Positive for arthralgias, back pain and myalgias.       Objective:   Physical Exam        Assessment & Plan:

## 2017-04-16 NOTE — Patient Instructions (Signed)
Hallux Rigidus Hallux rigidus is a type of joint pain or joint disease (arthritis) that affects your big toe (hallux). This condition involves the joint that connects the base of your big toe to the main part of your foot (metatarsophalangeal joint). This condition can cause your big toe to become stiff, painful, and difficult to move. Symptoms may get worse with movement or in cold or damp weather. The condition also gets worse over time. What are the causes? This condition may be caused by having a foot that does not function the way that it should or has an abnormal shape (structural deformity). These foot problems can run in families (be hereditary). This condition can also be caused by:  Injury.  Overuse.  Certain inflammatory diseases, including gout and rheumatoid arthritis.  What increases the risk? This condition is more likely to develop in people who:  Have a foot bone (metatarsal) that is longer or higher than normal.  Have a family history of hallux rigidus.  Have previously injured their big toe.  Have feet that do not have a curve (arch) on the inner side of the foot. This may be called flat feet or fallen arches.  Turn their ankles in when they walk (pronation).  Have rheumatoid arthritis or gout.  Have to stoop down often at work.  What are the signs or symptoms? Symptoms of this condition include:  Big toe pain.  Stiffness and difficulty moving the big toe.  Swelling of the toe and surrounding area.  Bone spurs. These are bony growths that can form on the joint of the big toe.  A limp.  How is this diagnosed? This condition is diagnosed based on a medical history and physical exam. This may include X-rays. How is this treated? Treatment for this condition includes:  Wearing roomy, comfortable shoes that have a large toe box.  Putting orthotic devices in your shoes.  Pain medicines.  Physical therapy.  Icing the injured area.  Alternate  between putting your foot in cold water then warm water.  If your condition is severe, treatment may include:  Corticosteroid injections to relieve pain.  Surgery to remove bone spurs, fuse damaged bones together, or replace the entire joint.  Follow these instructions at home:  Take over-the-counter and prescription medicines only as told by your health care provider.  Do not wear high heels or other restrictive footwear. Wear comfortable, supportive shoes that have a large toe box.  Wear orthotics as told by your health care provider, if this applies.  Put your feet in cold water for 30 seconds, then in warm water for 30 seconds. Alternate between the cold and warm water for 5 minutes. Do this several times a day or as told by your health care provider.  If directed, apply ice to the injured area. ? Put ice in a plastic bag. ? Place a towel between your skin and the bag. ? Leave the ice on for 20 minutes, 2-3 times per day.  Do foot exercises as instructed by your health care provider or a physical therapist.  Keep all follow-up visits as told by your health care provider. This is important. Contact a health care provider if:  You notice bone spurs or growths on or around your big toe.  Your pain does not get better or it gets worse.  You have pain while resting.  You have pain in other parts of your body, such as your back, hip, or knee.  You start to limp.   This information is not intended to replace advice given to you by your health care provider. Make sure you discuss any questions you have with your health care provider. Document Released: 05/22/2005 Document Revised: 10/28/2015 Document Reviewed: 01/27/2015 Elsevier Interactive Patient Education  2018 [employer]vier Inc.  

## 2017-04-16 NOTE — Progress Notes (Signed)
   Subjective:    Patient ID: Kirk Mueller, male    DOB: 10/07/1960, 56 y.o.   MRN: [id number]  HPI    Review of Systems  All other systems reviewed and are negative.      Objective:   Physical Exam        Assessment & Plan:

## 2017-05-11 ENCOUNTER — Telehealth: Payer: Self-pay | Admitting: *Deleted

## 2017-05-11 NOTE — Telephone Encounter (Signed)
"  I'm calling in reference to rescheduling a surgical procedure for the first Tuesday in February, if possible.  If you could, call me back.  Otherwise, I'll try to make contact with you again.  I rescheduled my pre-surgery appointment with Dr. Charlsie Merlesegal from Monday of next week to Friday of next week.  I'd like to have this surgery in February rather than January due to some things I'm committed to, that will be better for me.  Hopefully you can squeeze me in.  I appreciate it."

## 2017-05-14 ENCOUNTER — Ambulatory Visit: Admitting: Podiatry

## 2017-05-17 NOTE — Telephone Encounter (Signed)
I attempted to return his call.  I left him a message to call me tomorrow about rescheduling his surgery.

## 2017-05-18 ENCOUNTER — Ambulatory Visit (INDEPENDENT_AMBULATORY_CARE_PROVIDER_SITE_OTHER): Payer: Commercial Managed Care - PPO | Admitting: Podiatry

## 2017-05-18 ENCOUNTER — Encounter: Payer: Self-pay | Admitting: Podiatry

## 2017-05-18 DIAGNOSIS — M2021 Hallux rigidus, right foot: Secondary | ICD-10-CM

## 2017-05-18 NOTE — Patient Instructions (Signed)
Pre-Operative Instructions  Congratulations, you have decided to take an important step towards improving your quality of life.  You can be assured that the doctors and staff at [facility] will be with you every step of the way.  Here are some important things you should know:  1. Plan to be at the surgery center/hospital at least 1 (one) hour prior to your scheduled time, unless otherwise directed by the surgical center/hospital staff.  You must have a responsible adult accompany you, remain during the surgery and drive you home.  Make sure you have directions to the surgical center/hospital to ensure you arrive on time. 2. If you are having surgery at [facility] or [facility], you will need a copy of your medical history and physical form from your family physician within one month prior to the date of surgery. We will give you a form for your primary physician to complete.  3. We make every effort to accommodate the date you request for surgery.  However, there are times where surgery dates or times have to be moved.  We will contact you as soon as possible if a change in schedule is required.   4. No aspirin/ibuprofen for one week before surgery.  If you are on aspirin, any non-steroidal anti-inflammatory medications (Mobic, Aleve, Ibuprofen) should not be taken seven (7) days prior to your surgery.  You make take Tylenol for pain prior to surgery.  5. Medications - If you are taking daily heart and blood pressure medications, seizure, reflux, allergy, asthma, anxiety, pain or diabetes medications, make sure you notify the surgery center/hospital before the day of surgery so they can tell you which medications you should take or avoid the day of surgery. 6. No food or drink after midnight the night before surgery unless directed otherwise by surgical center/hospital staff. 7. No alcoholic beverages 24-hours prior to surgery.  No smoking 24-hours prior or 24-hours after  surgery. 8. Wear loose pants or shorts. They should be loose enough to fit over bandages, boots, and casts. 9. Don't wear slip-on shoes. Sneakers are preferred. 10. Bring your boot with you to the surgery center/hospital.  Also bring crutches or a walker if your physician has prescribed it for you.  If you do not have this equipment, it will be provided for you after surgery. 11. If you have not been contacted by the surgery center/hospital by the day before your surgery, call to confirm the date and time of your surgery. 12. Leave-time from work may vary depending on the type of surgery you have.  Appropriate arrangements should be made prior to surgery with your employer. 13. Prescriptions will be provided immediately following surgery by your doctor.  Fill these as soon as possible after surgery and take the medication as directed. Pain medications will not be refilled on weekends and must be approved by the doctor. 14. Remove nail polish on the operative foot and avoid getting pedicures prior to surgery. 15. Wash the night before surgery.  The night before surgery wash the foot and leg well with water and the antibacterial soap provided. Be sure to pay special attention to beneath the toenails and in between the toes.  Wash for at least three (3) minutes. Rinse thoroughly with water and dry well with a towel.  Perform this wash unless told not to do so by your physician.  Enclosed: 1 Ice pack (please put in freezer the night before surgery)   1 Hibiclens skin cleaner     Pre-op instructions  If you have any questions regarding the instructions, please do not hesitate to call our office.  [place]: [address], [place], [place] [address] -- [phone]  [place]: [address] [place], [place] [address] -- [phone]  [place]: [address]  [place], [place] [address] -- [phone]  [place]: [address], [address], [place], [place] [address] -- [phone]  Website:  [email or web address] 

## 2017-05-18 NOTE — Telephone Encounter (Addendum)
I am returning your call about rescheduling your surgery.  "Yes, I'm actually coming in there today for an appointment.  I have a lot of things I have to do in January so I want to hold off until the first couple of weeks in February."  He can do it on February 5.  "That date will be perfect.  I will see you soon."  I called Kirk [facility]ch[facility]ynthia [facility] and rescheduled the surgery from 06/12/2017 to 07/10/2017.

## 2017-05-19 NOTE — Progress Notes (Signed)
Subjective:   Patient ID: Patterson HammersmithJohn H Hinners, male   DOB: 56 y.o.   MRN: [id number]   HPI Patient presents for signing consent form for correction of chronic hallux limitus rigidus deformity right.   ROS      Objective:  Physical Exam  Neurovascular status intact with patient noted to have a narrowed first MPJ right with dorsal spurring reduced range of motion and no crepitus with movement.  It is quite restricted as far as motion goes with the joint     Assessment:  Hallux limitus rigidus with the possibility for cartilage damage right     Plan:  H&P condition reviewed at great length.  At this point I have recommended probable osteotomy to lower plantar flex the first metatarsal with possible Partida implant or subchondral bone drilling with the possibility long-term this will require fusion or full joint implantation.  Patient was allowed to read consent form going over alternative treatment and complications and everything specific to this case and does understand that final total recovery can take 6 months to 1 year.  Patient signed consent form and was dispensed air fracture walker for the postoperative period and all questions were answered and he is encouraged to call with any questions prior to procedure

## 2017-06-05 HISTORY — PX: TOE SURGERY: SHX1073

## 2017-06-11 DIAGNOSIS — M25531 Pain in right wrist: Secondary | ICD-10-CM | POA: Insufficient documentation

## 2017-06-11 DIAGNOSIS — M7710 Lateral epicondylitis, unspecified elbow: Secondary | ICD-10-CM | POA: Insufficient documentation

## 2017-07-10 ENCOUNTER — Encounter: Payer: Self-pay | Admitting: Podiatry

## 2017-07-10 ENCOUNTER — Telehealth: Payer: Self-pay | Admitting: Podiatry

## 2017-07-10 DIAGNOSIS — M2011 Hallux valgus (acquired), right foot: Secondary | ICD-10-CM

## 2017-07-10 DIAGNOSIS — M2021 Hallux rigidus, right foot: Secondary | ICD-10-CM

## 2017-07-10 NOTE — Telephone Encounter (Signed)
[facility] [place] Call back number is [phone]. This is in regards to the prescription for percocet 10-325 #25. It's coming back that the max daily is 3 tablets daily by insurance. We need to know if it is post-surgery and then also the max day supply we are allowed is 7 day if it is post surgery otherwise it would be a 5 day supply. Just give us a call back for more information. Thank you.

## 2017-07-10 NOTE — Telephone Encounter (Signed)
Jonelle - [facility] [phone] states percocet is over the 50mEq approved by the FDA, would need to be 3 times a day dispense 21. Dr. Charlsie Merlesegal states that is fine.

## 2017-07-11 ENCOUNTER — Telehealth: Payer: Self-pay | Admitting: *Deleted

## 2017-07-11 NOTE — Telephone Encounter (Signed)
Called patient and patient stated that he was doing pretty good and there was some pain on the outside of his foot due to having some plantar's warts and had to take the boot off and the incision was not hurting at all and did have some percocet 5/325 at home from hurting his back and took one at 2 am and patient is waiting to get the RX today at 6 pm and patient is using the crutches and is moving around good and has taken the boot off to sleep and does elevate and ice and there is no fever,chills,nausea and I stated to patient to call the office with any concerns or questions. Misty StanleyLisa

## 2017-07-13 ENCOUNTER — Telehealth: Payer: Self-pay | Admitting: Podiatry

## 2017-07-13 NOTE — Telephone Encounter (Signed)
Left message informing pt that if he would tell his wife his boot size we could replace and he would not have to come in or leave a message on my voicemail with the size and we could have it ready for his wife. I also told him we would be closing a 4:00pm so he would not have to make a trip for no reason.

## 2017-07-13 NOTE — Telephone Encounter (Signed)
Pt called and had surgery Tuesday and the boot he was given has fallen apart. Per Val pt needs tocome in today to get a new one. Pt states he is not able to drive and is calling his wife to see when they can come in today. He will probably just show up when he can.

## 2017-07-18 ENCOUNTER — Ambulatory Visit (INDEPENDENT_AMBULATORY_CARE_PROVIDER_SITE_OTHER): Payer: Commercial Managed Care - PPO | Admitting: Podiatry

## 2017-07-18 ENCOUNTER — Encounter: Payer: Self-pay | Admitting: Podiatry

## 2017-07-18 ENCOUNTER — Ambulatory Visit (INDEPENDENT_AMBULATORY_CARE_PROVIDER_SITE_OTHER): Payer: Commercial Managed Care - PPO

## 2017-07-18 DIAGNOSIS — M2021 Hallux rigidus, right foot: Secondary | ICD-10-CM

## 2017-07-18 NOTE — Progress Notes (Signed)
Subjective:   Patient ID: Kirk HammersmithJohn H Mueller, male   DOB: 57 y.o.   MRN: [id number]   HPI Patient states doing very well with minimal discomfort but he admits he has not been walking on his foot as we had hoped   ROS      Objective:  Physical Exam  Neurovascular status intact negative Homans sign noted with patient's right foot healing well with wound edges well coapted hallux in rectus position and reasonable range of motion for 1 week postop with no crepitus     Assessment:  Patient is doing well surgically with wound edges well coapted good alignment and reasonable range of motion for this.  Postop     Plan:  H&P condition reviewed and recommended range of motion exercises along with continued anti-inflammatories physical therapy and I am sending to physical therapist for aggressive range of motion due to the diminishment of motion and the fact that the goal of the surgery is to increase motion.  Patient will be seen back by me in 2 weeks and sterile dressing reapplied with instructions for continued elevation immobilization  X-ray indicates the osteotomy is healing well with fixation in place joint congruence and open

## 2017-07-19 NOTE — Addendum Note (Signed)
Addended by: Alphia Kava'CONNELL, Ahri Olson D on: 07/19/2017 01:22 PM   Modules accepted: Orders

## 2017-08-01 ENCOUNTER — Encounter: Payer: Self-pay | Admitting: Podiatry

## 2017-08-01 ENCOUNTER — Ambulatory Visit (INDEPENDENT_AMBULATORY_CARE_PROVIDER_SITE_OTHER): Payer: Commercial Managed Care - PPO

## 2017-08-01 ENCOUNTER — Ambulatory Visit (INDEPENDENT_AMBULATORY_CARE_PROVIDER_SITE_OTHER): Payer: Commercial Managed Care - PPO | Admitting: Podiatry

## 2017-08-01 DIAGNOSIS — M2021 Hallux rigidus, right foot: Secondary | ICD-10-CM

## 2017-08-01 NOTE — Progress Notes (Signed)
Subjective:   Patient ID: Kirk Mueller, male   DOB: 57 y.o.   MRN: [id number]   HPI Patient states right foot is doing great with minimal discomfort and good range of motion   ROS      Objective:  Physical Exam  Neurovascular status intact with good range of motion of the first MPJ right with no crepitus of the joint     Assessment:  Doing well post osteotomy first metatarsal right with good range of motion     Plan:  Continue physical therapy range of motion exercises and reappoint 4 weeks or earlier if needed  X-rays indicate osteotomy is healing well joint appears open with good alignment noted

## 2017-08-29 ENCOUNTER — Ambulatory Visit (INDEPENDENT_AMBULATORY_CARE_PROVIDER_SITE_OTHER): Payer: Commercial Managed Care - PPO

## 2017-08-29 ENCOUNTER — Ambulatory Visit (INDEPENDENT_AMBULATORY_CARE_PROVIDER_SITE_OTHER): Admitting: Podiatry

## 2017-08-29 ENCOUNTER — Encounter: Payer: Self-pay | Admitting: Podiatry

## 2017-08-29 DIAGNOSIS — M2021 Hallux rigidus, right foot: Secondary | ICD-10-CM

## 2017-08-29 NOTE — Progress Notes (Signed)
Subjective:   Patient ID: Kirk Mueller, male   DOB: 57 y.o.   MRN: [id number]   HPI Patient presents stating the right foot is doing well and I have done very well with physical therapy and I have tried to work the range of motion as best as possible   ROS      Objective:  Physical Exam  Neurovascular status intact with patient's right foot showing good range of motion with mild edema still noted wound edges well coapted     Assessment:  Doing well post osteotomy right first metatarsal     Plan:  Advised to continue range of motion exercises continued diminished activity but gradual increase in activity levels.  Reappoint to recheck  X-rays indicate that the osteotomy is healed very well the joint is congruous with no indications of spur formation

## 2017-10-15 ENCOUNTER — Ambulatory Visit (INDEPENDENT_AMBULATORY_CARE_PROVIDER_SITE_OTHER): Payer: Commercial Managed Care - PPO

## 2017-10-15 ENCOUNTER — Encounter: Payer: Self-pay | Admitting: Podiatry

## 2017-10-15 ENCOUNTER — Ambulatory Visit (INDEPENDENT_AMBULATORY_CARE_PROVIDER_SITE_OTHER): Payer: Commercial Managed Care - PPO | Admitting: Podiatry

## 2017-10-15 VITALS — Ht 69.0 in | Wt 195.0 lb

## 2017-10-15 DIAGNOSIS — M2021 Hallux rigidus, right foot: Secondary | ICD-10-CM | POA: Diagnosis not present

## 2017-10-17 NOTE — Progress Notes (Signed)
Subjective:   Patient ID: Kirk Mueller, male   DOB: 57 y.o.   MRN: [id number]   HPI Patient presents stating that he is doing much better and very happy and is working on the range of motion   ROS      Objective:  Physical Exam  Neurovascular status intact negative Homans sign noted with patient's right incision site healing very well with wound edges well coapted good range of motion which is adequate with no crepitus of the joint noted     Assessment:  Doing well post biplanar osteotomy right with good range of motion no crepitus     Plan:  Advised to continue range of motion exercises gradual return to all normal shoes and reappoint as needed  X-rays indicate that there is good healing of the osteotomy with good positional component noted

## 2018-07-03 ENCOUNTER — Ambulatory Visit: Payer: Commercial Managed Care - PPO | Admitting: Podiatry

## 2018-07-17 ENCOUNTER — Encounter: Payer: Self-pay | Admitting: Podiatry

## 2018-07-17 ENCOUNTER — Ambulatory Visit (INDEPENDENT_AMBULATORY_CARE_PROVIDER_SITE_OTHER): Payer: Commercial Managed Care - PPO | Admitting: Podiatry

## 2018-07-17 DIAGNOSIS — M2021 Hallux rigidus, right foot: Secondary | ICD-10-CM | POA: Diagnosis not present

## 2018-07-17 DIAGNOSIS — Q828 Other specified congenital malformations of skin: Secondary | ICD-10-CM

## 2018-07-17 NOTE — Progress Notes (Signed)
Subjective:   Patient ID: Kirk Mueller, male   DOB: 58 y.o.   MRN: [id number]   HPI Patient states my foot is doing great where I had surgery but I do have these lesions and I am not sure if they are warts or what they may be make it tender   ROS      Objective:  Physical Exam  Neurovascular status intact with several small lesions of the right second metatarsal and left lateral plantar foot that have small little nucleated calluses     Assessment:  Doing well post hallux limitus surgery with porokeratotic type lesions     Plan:  Sterile debridement accomplished bilateral with no iatrogenic bleeding and reappoint for routine care with education rendered concerning lesions and also discussion of hallux limitus

## 2019-06-09 DIAGNOSIS — M25561 Pain in right knee: Secondary | ICD-10-CM | POA: Insufficient documentation

## 2019-10-10 ENCOUNTER — Ambulatory Visit: Payer: Commercial Managed Care - PPO | Admitting: Podiatry

## 2019-10-15 ENCOUNTER — Encounter: Payer: Self-pay | Admitting: Podiatry

## 2019-10-15 ENCOUNTER — Ambulatory Visit (INDEPENDENT_AMBULATORY_CARE_PROVIDER_SITE_OTHER): Payer: Commercial Managed Care - PPO | Admitting: Podiatry

## 2019-10-15 ENCOUNTER — Other Ambulatory Visit: Payer: Self-pay

## 2019-10-15 ENCOUNTER — Ambulatory Visit (INDEPENDENT_AMBULATORY_CARE_PROVIDER_SITE_OTHER): Payer: Commercial Managed Care - PPO

## 2019-10-15 DIAGNOSIS — M2021 Hallux rigidus, right foot: Secondary | ICD-10-CM

## 2019-10-15 DIAGNOSIS — M7751 Other enthesopathy of right foot: Secondary | ICD-10-CM

## 2019-10-15 DIAGNOSIS — M779 Enthesopathy, unspecified: Secondary | ICD-10-CM

## 2019-10-15 DIAGNOSIS — Q828 Other specified congenital malformations of skin: Secondary | ICD-10-CM

## 2019-10-15 NOTE — Progress Notes (Signed)
Subjective:   Patient ID: Kirk Mueller, male   DOB: 59 y.o.   MRN: [id number]   HPI Patient presents stating that he has 2 spots that are really hurting 1 being under the forefoot right and 1 being on the outside of the foot with a long-term history of lesions underneath the foot and also history of hallux limitus surgery which he wants checked and states is doing well   ROS      Objective:  Physical Exam  Neurovascular status intact with inflammation of the fifth metatarsal base right at the insertion peroneal and also second MPJ right with inflammation fluid buildup along with scar right first MPJ with mild restriction of motion     Assessment:  Peroneal tendinitis right with second MPJ capsulitis with porokeratotic plantar lesions and hallux limitus right which overall is doing well with surgical intervention     Plan:  8 NP reviewed all conditions and today I went ahead did sterile prep and injected the peroneal insertion right 3 mg dexamethasone 5 mm Xylocaine in the second MPJ right 3 mg Dexasone 5 mg Xylocaine advised on reduced activity debrided plantar lesions discussed the hallux limitus and gradual return to normal activities and reappoint for [place] to recheck

## 2020-09-30 ENCOUNTER — Ambulatory Visit: Payer: Self-pay | Admitting: Urology

## 2020-10-01 ENCOUNTER — Ambulatory Visit
Admission: RE | Admit: 2020-10-01 | Discharge: 2020-10-01 | Disposition: A | Discharge status: Home / Self Care | Payer: Commercial Managed Care - PPO | Attending: Urology | Admitting: Urology

## 2020-10-01 ENCOUNTER — Encounter: Payer: Self-pay | Admitting: Urology

## 2020-10-01 ENCOUNTER — Other Ambulatory Visit: Payer: Self-pay

## 2020-10-01 ENCOUNTER — Ambulatory Visit (INDEPENDENT_AMBULATORY_CARE_PROVIDER_SITE_OTHER): Payer: Commercial Managed Care - PPO | Admitting: Urology

## 2020-10-01 ENCOUNTER — Other Ambulatory Visit
Admission: RE | Admit: 2020-10-01 | Discharge: 2020-10-01 | Disposition: A | Discharge status: Home / Self Care | Payer: Commercial Managed Care - PPO | Source: Home / Self Care | Attending: Urology | Admitting: Urology

## 2020-10-01 ENCOUNTER — Ambulatory Visit
Admission: RE | Admit: 2020-10-01 | Discharge: 2020-10-01 | Disposition: A | Discharge status: Home / Self Care | Payer: Commercial Managed Care - PPO | Source: Ambulatory Visit | Attending: Urology | Admitting: Urology

## 2020-10-01 ENCOUNTER — Other Ambulatory Visit: Payer: Self-pay | Admitting: *Deleted

## 2020-10-01 VITALS — BP 144/84 | HR 64 | Ht 69.0 in | Wt 204.0 lb

## 2020-10-01 DIAGNOSIS — N200 Calculus of kidney: Secondary | ICD-10-CM

## 2020-10-01 DIAGNOSIS — N289 Disorder of kidney and ureter, unspecified: Secondary | ICD-10-CM

## 2020-10-01 LAB — URINALYSIS, COMPLETE (UACMP) WITH MICROSCOPIC
Bilirubin Urine: NEGATIVE
Glucose, UA: NEGATIVE mg/dL
Ketones, ur: NEGATIVE mg/dL
Leukocytes,Ua: NEGATIVE
Nitrite: NEGATIVE
Protein, ur: NEGATIVE mg/dL
Specific Gravity, Urine: 1.025 (ref 1.005–1.030)
pH: 5.5 (ref 5.0–8.0)

## 2020-10-01 NOTE — Progress Notes (Signed)
10/01/2020 1:04 PM   [name] May 25, 1961 [id number]  Referring provider: Ignatius Specking, MD [address] [place],  [place] [address]  Chief Complaint  Patient presents with  . New Patient (Initial Visit)    Kidney stone    HPI: 60-year-old male who presents today for further evaluation of kidney stones.  He was seen and evaluated emergently at [facility] emergency room on 09/22/2018 for acute onset right flank pain.  It radiated down to his right groin.  It was severe, "ice pick" stabbing pain worse than any other pain in his whole life.  He underwent a CT scan which showed a 2 mm right UVJ stone with some right renal pelvic fullness.  He also has a nonobstructing left 4 mm stone in the mid lower pole as well as a new 2 cm fluid-filled lesion on his left side.    Ultimately, his pain was able to be controlled and he was discharged from the emergency room with pain meds and Flomax.  He followed up with his primary care and then with [place].  He had a second episode of severe pain a few days after his emergency room visit.  He does recall seeing the stone during this pain crisis but he is only had some dull aching in his left lower quadrant area but no further episodes of severe pain.  No previous history of kidney stones.  He did have a lengthy discussion today about his diet.  He does like to drink green tea and has upped his water recently to about 2 L a day but was not drinking that much previously.   PMH: Past Medical History:  Diagnosis Date  . Arthritis   . Complication of anesthesia    headache, very active when came out of anesthesia   . GERD (gastroesophageal reflux disease)   . Headache   . Heart murmur    as a teenager   . Hypercholesteremia   . Hypertension     Surgical History: Past Surgical History:  Procedure Laterality Date  . brown recluse bite    . COLONOSCOPY    . KNEE ARTHROSCOPY  right  . left leg surgery    . LUMBAR LAMINECTOMY/DECOMPRESSION  MICRODISCECTOMY N/A 05/17/2016   Procedure: MICRO LUMBER DECOMPRESSION L4-5, L5-S1;  Surgeon: Jene Every, MD;  Location: [facility] ORS;  Service: Orthopedics;  Laterality: N/A;    Home Medications:  Allergies as of 10/01/2020      Reactions   Other Other (See Comments)   Other reaction(s): Unknown   Tuberculin Purified Protein Derivative       Medication List       Accurate as of October 01, 2020  1:04 PM. If you have any questions, ask your nurse or doctor.        STOP taking these medications   celecoxib 200 MG capsule Commonly known as: CELEBREX Stopped by: Vanna Scotland, MD   docusate sodium 100 MG capsule Commonly known as: Colace Stopped by: Vanna Scotland, MD   doxycycline 100 MG tablet Commonly known as: VIBRA-TABS Stopped by: Vanna Scotland, MD   methocarbamol 500 MG tablet Commonly known as: Robaxin Stopped by: Vanna Scotland, MD   oxyCODONE-acetaminophen 5-325 MG tablet Commonly known as: Percocet Stopped by: Vanna Scotland, MD   polyethylene glycol 17 g packet Commonly known as: MIRALAX / GLYCOLAX Stopped by: Vanna Scotland, MD     TAKE these medications   acetaminophen 500 MG tablet Commonly known as: TYLENOL Take 500 mg  by mouth every 6 (six) hours as needed (for pain).   gabapentin 300 MG capsule Commonly known as: NEURONTIN Take 300 mg by mouth 3 (three) times daily.   metoprolol tartrate 25 MG tablet Commonly known as: LOPRESSOR Take 12.5 mg by mouth 2 (two) times daily.   rosuvastatin 5 MG tablet Commonly known as: CRESTOR Take 5 mg by mouth daily.   SUMAtriptan 50 MG tablet Commonly known as: IMITREX       Allergies:  Allergies  Allergen Reactions  . Other Other (See Comments)    Other reaction(s): Unknown  . Tuberculin Purified Protein Derivative     Family History: No family history on file.  Social History:  reports that he has never smoked. He has never used smokeless tobacco. He reports that he does not drink alcohol and  does not use drugs.   Physical Exam: BP (!) 144/84   Pulse 64   Ht 5\' 9"  (1.753 m)   Wt 204 lb (92.5 kg)   BMI 30.13 kg/m   Constitutional:  Alert and oriented, No acute distress. HEENT: [place] AT, moist mucus membranes.  Trachea midline, no masses. Cardiovascular: No clubbing, cyanosis, or edema. Respiratory: Normal respiratory effort, no increased work of breathing. Skin: No rashes, bruises or suspicious lesions. Neurologic: Grossly intact, no focal deficits, moving all 4 extremities. Psychiatric: Normal mood and affect.  Laboratory Data: Lab Results  Component Value Date   WBC 7.0 05/12/2016   HGB 16.4 05/12/2016   HCT 46.1 05/12/2016   MCV 88.7 05/12/2016   PLT 206 05/12/2016    Lab Results  Component Value Date   CREATININE 0.90 05/12/2016    Urinalysis    Component Value Date/Time   COLORURINE YELLOW 10/01/2020 1005   APPEARANCEUR CLEAR 10/01/2020 1005   LABSPEC 1.025 10/01/2020 1005   PHURINE 5.5 10/01/2020 1005   GLUCOSEU NEGATIVE 10/01/2020 1005   HGBUR TRACE (A) 10/01/2020 1005   BILIRUBINUR NEGATIVE 10/01/2020 1005   KETONESUR NEGATIVE 10/01/2020 1005   PROTEINUR NEGATIVE 10/01/2020 1005   NITRITE NEGATIVE 10/01/2020 1005   LEUKOCYTESUR NEGATIVE 10/01/2020 1005    Lab Results  Component Value Date   BACTERIA MANY (A) 10/01/2020    Pertinent Imaging: Impression Performed by [facility] RAD 1. Early/mildly obstructive 2 mm right ureterovesicular junction  stone. Correlate with urinalysis for superimposed infection.  2. Scattered sigmoid diverticulosis with no acute diverticulitis.  3. Aortic Atherosclerosis (ICD10-I70.0).    Electronically Signed  By: [facility] M.D.  On: 09/21/2020 22:03  Narrative Performed by [facility] RAD CLINICAL DATA: Right flank pain. The stone suspected.   EXAM:  CT ABDOMEN AND PELVIS WITHOUT CONTRAST   TECHNIQUE:  Multidetector CT imaging of the abdomen and pelvis was performed  following the standard protocol  without IV contrast.   COMPARISON: CT lumbar spine report without imaging 12/27/2015, CT  abdomen pelvis 05/24/2004   FINDINGS:  Lower chest: Interval development 7 mm paraesophageal lymph node. No  acute abnormality.   Hepatobiliary: No focal liver abnormality. No gallstones,  gallbladder wall thickening, or pericholecystic fluid. No biliary  dilatation.   Pancreas: Diffusely atrophic. No focal lesion. Otherwise normal  pancreatic contour. No surrounding inflammatory changes. No main  pancreatic ductal dilatation.   Spleen: Normal in size without focal abnormality.   Adrenals/Urinary Tract:   No adrenal nodule bilaterally.   Punctate right nephrolithiasis. Up to 4 mm left nephrolithiasis. 2  mm right ureterovesicular junction calcified stone. No left  ureterolithiasis. Mild fullness of the right collecting system  compared to the right with no frank hydroureteronephrosis  bilaterally. Interval development of a 2.1 cm fluid density lesion  along the left inferior renal pole. No contour-deforming renal mass.   The urinary bladder is unremarkable.   Stomach/Bowel: Stomach is within normal limits. No evidence of bowel  wall thickening or dilatation. Scattered sigmoid diverticulosis  appendix appears normal.   Vascular/Lymphatic: No abdominal aorta or iliac aneurysm. Mild  atherosclerotic plaque of the aorta and its branches. No abdominal,  pelvic, or inguinal lymphadenopathy.   Reproductive: Prostate is unremarkable.   Other: No intraperitoneal free fluid. No intraperitoneal free gas.  No organized fluid collection.   Musculoskeletal:   No abdominal wall hernia or abnormality.   No suspicious lytic or blastic osseous lesions. No acute displaced  fracture.  I was able to review these images personally today via [facility].  Nonobstructing left-sided stone is in the mid to lower pole.  Assessment & Plan:    1. Nephrolithiasis 2 mm right UVJ stone, based on size  and location as well as no ongoing pain, he is likely spontaneously passed this at this point  We did discuss his other contralateral nonobstructing stone, 4 mm on the left  He is extremely anxious about the stone given his experience with the small 2 mm stone.   He would prefer to consider prophylactic treatment.  We had a brief discussion today about ureteroscopy versus ESWL, reasonable candidate for either treatment.  We will have him get a KUB today to see if the stone is visible on plain film and have him return to discuss this further.  We discussed general stone prevention techniques including drinking plenty water with goal of producing 2.5 L urine daily, increased citric acid intake, avoidance of high oxalate containing foods, and decreased salt intake.  Information about dietary recommendations given today. - Abdomen 1 view (KUB); Future  2. Renal lesion Based on CT scan, probable cyst but given that its new since his previous, recommend renal ultrasound for further characterization - US RENAL; Future   Return for 2-4wk follow up after RUS results.  Vanna Scotland, MD  [facility] [address] [place], [place] [address] [phone]

## 2020-10-15 ENCOUNTER — Other Ambulatory Visit: Payer: Self-pay

## 2020-10-15 ENCOUNTER — Ambulatory Visit: Admitting: Urology

## 2020-10-15 ENCOUNTER — Ambulatory Visit (INDEPENDENT_AMBULATORY_CARE_PROVIDER_SITE_OTHER): Payer: Commercial Managed Care - PPO | Admitting: Urology

## 2020-10-15 ENCOUNTER — Encounter: Payer: Self-pay | Admitting: Urology

## 2020-10-15 ENCOUNTER — Ambulatory Visit
Admission: RE | Admit: 2020-10-15 | Discharge: 2020-10-15 | Disposition: A | Discharge status: Home / Self Care | Payer: Commercial Managed Care - PPO | Source: Ambulatory Visit | Attending: Urology | Admitting: Urology

## 2020-10-15 VITALS — BP 125/72 | HR 64 | Ht 69.0 in | Wt 204.0 lb

## 2020-10-15 DIAGNOSIS — R1084 Generalized abdominal pain: Secondary | ICD-10-CM | POA: Diagnosis not present

## 2020-10-15 DIAGNOSIS — N289 Disorder of kidney and ureter, unspecified: Secondary | ICD-10-CM

## 2020-10-15 DIAGNOSIS — R109 Unspecified abdominal pain: Secondary | ICD-10-CM | POA: Insufficient documentation

## 2020-10-15 DIAGNOSIS — N200 Calculus of kidney: Secondary | ICD-10-CM | POA: Diagnosis not present

## 2020-10-15 NOTE — H&P (View-Only) (Signed)
10/15/2020 3:00 PM   Kirk Mueller 12-13-1960 [id number]  Referring provider: Ignatius Specking, MD [address] [place],  [place] [address]  Chief Complaint  Patient presents with  . Follow-up    2 week follow-up to discuss results    HPI: 60-year-old male who returns today to discuss kidney stones.  Please see previous notes for details.  He is unsure whether or not he is passed the right distal ureteral stone.  He continues have some dull right flank pain but this is nothing like his acute episode.  He never saw the stone pass.  KUB indicates a nonobstructing at 8 mm stone which is somewhat barbell shaped in his left kidney.  He is concerned about the stone today.  He is asymptomatic from this.  He also has a known renal cyst for which he is scheduled to undergo renal ultrasound which is yet to be performed.   PMH: Past Medical History:  Diagnosis Date  . Arthritis   . Complication of anesthesia    headache, very active when came out of anesthesia   . GERD (gastroesophageal reflux disease)   . Headache   . Heart murmur    as a teenager   . Hypercholesteremia   . Hypertension     Surgical History: Past Surgical History:  Procedure Laterality Date  . brown recluse bite    . COLONOSCOPY    . KNEE ARTHROSCOPY  right  . left leg surgery    . LUMBAR LAMINECTOMY/DECOMPRESSION MICRODISCECTOMY N/A 05/17/2016   Procedure: MICRO LUMBER DECOMPRESSION L4-5, L5-S1;  Surgeon: Jene Every, MD;  Location: [facility];  Service: Orthopedics;  Laterality: N/A;    Home Medications:  Allergies as of 10/15/2020      Reactions   Other Other (See Comments)   Other reaction(s): Unknown   Tuberculin Purified Protein Derivative       Medication List       Accurate as of Oct 15, 2020  3:00 PM. If you have any questions, ask your nurse or doctor.        STOP taking these medications   acetaminophen 500 MG tablet Commonly known as: TYLENOL Stopped by: Vanna Scotland, MD   gabapentin 300  MG capsule Commonly known as: NEURONTIN Stopped by: Vanna Scotland, MD     TAKE these medications   metoprolol tartrate 25 MG tablet Commonly known as: LOPRESSOR Take 12.5 mg by mouth 2 (two) times daily.   rosuvastatin 5 MG tablet Commonly known as: CRESTOR Take 5 mg by mouth daily.   SUMAtriptan 50 MG tablet Commonly known as: IMITREX       Allergies:  Allergies  Allergen Reactions  . Other Other (See Comments)    Other reaction(s): Unknown  . Tuberculin Purified Protein Derivative     Family History: No family history on file.  Social History:  reports that he has never smoked. He has never used smokeless tobacco. He reports that he does not drink alcohol and does not use drugs.   Physical Exam: BP 125/72   Pulse 64   Ht 5\' 9"  (1.753 m)   Wt 204 lb (92.5 kg)   BMI 30.13 kg/m   Constitutional:  Alert and oriented, No acute distress. HEENT: [place] AT, moist mucus membranes.  Trachea midline, no masses. Cardiovascular: No clubbing, cyanosis, or edema. Respiratory: Normal respiratory effort, no increased work of breathing. Skin: No rashes, bruises or suspicious lesions. Neurologic: Grossly intact, no focal deficits, moving all 4 extremities. Psychiatric: Normal  mood and affect.  Urinalysis Results for orders placed or performed during the hospital encounter of 10/01/20  Urinalysis, Complete w Microscopic  Result Value Ref Range   Color, Urine YELLOW YELLOW   APPearance CLEAR CLEAR   Specific Gravity, Urine 1.025 1.005 - 1.030   pH 5.5 5.0 - 8.0   Glucose, UA NEGATIVE NEGATIVE mg/dL   Hgb urine dipstick TRACE (A) NEGATIVE   Bilirubin Urine NEGATIVE NEGATIVE   Ketones, ur NEGATIVE NEGATIVE mg/dL   Protein, ur NEGATIVE NEGATIVE mg/dL   Nitrite NEGATIVE NEGATIVE   Leukocytes,Ua NEGATIVE NEGATIVE   Squamous Epithelial / LPF 0-5 0 - 5   WBC, UA 0-5 0 - 5 WBC/hpf   RBC / HPF 0-5 0 - 5 RBC/hpf   Bacteria, UA MANY (A) NONE SEEN   Mucus PRESENT    Hyaline Casts,  UA PRESENT     Pertinent Imaging:  Abdomen 1 view (KUB)  Narrative CLINICAL DATA:  Right low back pain. History of urinary tract stones.  EXAM: ABDOMEN - 1 VIEW  COMPARISON:  None.  FINDINGS: No evidence of right urinary tract stone is identified. There is a 0.8 cm calcification projecting over the mid to lower pole of the left kidney compatible with a nonobstructing stone. Bowel gas pattern is normal. No acute or focal bony abnormality.  IMPRESSION: Findings compatible with a 0.8 cm nonobstructing stone in the left kidney. The exam is otherwise negative.   Electronically Signed By: Drusilla Kanner M.D. On: 10/04/2020 09:33  KUB was personally reviewed today in the patient's room.  He was also able to review this personally.  No obvious ureteral stone was seen.   Assessment & Plan:    1. Kidney stones He is extremely concerned about the 8 mm left nonobstructing stone  We did discuss today that if this does pass, I would likely be with some difficulty or he may require urgent intervention.  He is interested in treating this prior to becoming an emergency which is quite reasonable given the size of the stone.  We discussed various treatment options for urolithiasis including observation with or without medical expulsive therapy, shockwave lithotripsy (SWL), ureteroscopy and laser lithotripsy with stent placement, and percutaneous nephrolithotomy.   We discussed that management is based on stone size, location, density, patient co-morbidities, and patient preference.    Stones <4mm in size have a >80% spontaneous passage rate. Data surrounding the use of tamsulosin for medical expulsive therapy is controversial, but meta analyses suggests it is most efficacious for distal stones between 5-20mm in size. Possible side effects include dizziness/lightheadedness, and retrograde ejaculation.   SWL has a lower stone free rate in a single procedure, but also a lower complication  rate compared to ureteroscopy and avoids a stent and associated stent related symptoms. Possible complications include renal hematoma, steinstrasse, and need for additional treatment. We discussed the role of his increased skin to stone distance can lead to decreased efficacy with shockwave lithotripsy.   Ureteroscopy with laser lithotripsy and stent placement has a higher stone free rate than SWL in a single procedure, however increased complication rate including possible infection, ureteral injury, bleeding, and stent related morbidity. Common stent related symptoms include dysuria, urgency/frequency, and flank pain.   After an extensive discussion of the risks and benefits of the above treatment options, the patient would like to proceed with ESWL  Prior to completing this, he is still having some right flank pain.  Its important to ensure that he is passed his  contralateral ureteral stone which is obstructing.  He would like to have his procedure done on Thursday and thus we will order a stat noncontrast CT scan to assess for interval passage of the right stone and its its not clearly seen on KUB.  Agreeable this plan.  We will call him the results.  - Urinalysis, Complete w Microscopic; Future - Urine Culture; Future  2. Right flank pain As above - CT RENAL STONE STUDY  3. Renal lesion RUS pending     Vanna Scotland, MD  [facility] [address] [place], [place] [address] [phone]

## 2020-10-15 NOTE — Progress Notes (Signed)
10/15/2020 3:00 PM   Kirk Mueller 12-13-1960 [id number]  Referring provider: Ignatius Specking, MD [address] [place],  [place] [address]  Chief Complaint  Patient presents with  . Follow-up    2 week follow-up to discuss results    HPI: 60-year-old male who returns today to discuss kidney stones.  Please see previous notes for details.  He is unsure whether or not he is passed the right distal ureteral stone.  He continues have some dull right flank pain but this is nothing like his acute episode.  He never saw the stone pass.  KUB indicates a nonobstructing at 8 mm stone which is somewhat barbell shaped in his left kidney.  He is concerned about the stone today.  He is asymptomatic from this.  He also has a known renal cyst for which he is scheduled to undergo renal ultrasound which is yet to be performed.   PMH: Past Medical History:  Diagnosis Date  . Arthritis   . Complication of anesthesia    headache, very active when came out of anesthesia   . GERD (gastroesophageal reflux disease)   . Headache   . Heart murmur    as a teenager   . Hypercholesteremia   . Hypertension     Surgical History: Past Surgical History:  Procedure Laterality Date  . brown recluse bite    . COLONOSCOPY    . KNEE ARTHROSCOPY  right  . left leg surgery    . LUMBAR LAMINECTOMY/DECOMPRESSION MICRODISCECTOMY N/A 05/17/2016   Procedure: MICRO LUMBER DECOMPRESSION L4-5, L5-S1;  Surgeon: Jene Every, MD;  Location: [facility];  Service: Orthopedics;  Laterality: N/A;    Home Medications:  Allergies as of 10/15/2020      Reactions   Other Other (See Comments)   Other reaction(s): Unknown   Tuberculin Purified Protein Derivative       Medication List       Accurate as of Oct 15, 2020  3:00 PM. If you have any questions, ask your nurse or doctor.        STOP taking these medications   acetaminophen 500 MG tablet Commonly known as: TYLENOL Stopped by: Vanna Scotland, MD   gabapentin 300  MG capsule Commonly known as: NEURONTIN Stopped by: Vanna Scotland, MD     TAKE these medications   metoprolol tartrate 25 MG tablet Commonly known as: LOPRESSOR Take 12.5 mg by mouth 2 (two) times daily.   rosuvastatin 5 MG tablet Commonly known as: CRESTOR Take 5 mg by mouth daily.   SUMAtriptan 50 MG tablet Commonly known as: IMITREX       Allergies:  Allergies  Allergen Reactions  . Other Other (See Comments)    Other reaction(s): Unknown  . Tuberculin Purified Protein Derivative     Family History: No family history on file.  Social History:  reports that he has never smoked. He has never used smokeless tobacco. He reports that he does not drink alcohol and does not use drugs.   Physical Exam: BP 125/72   Pulse 64   Ht 5\' 9"  (1.753 m)   Wt 204 lb (92.5 kg)   BMI 30.13 kg/m   Constitutional:  Alert and oriented, No acute distress. HEENT: [place] AT, moist mucus membranes.  Trachea midline, no masses. Cardiovascular: No clubbing, cyanosis, or edema. Respiratory: Normal respiratory effort, no increased work of breathing. Skin: No rashes, bruises or suspicious lesions. Neurologic: Grossly intact, no focal deficits, moving all 4 extremities. Psychiatric: Normal  mood and affect.  Urinalysis Results for orders placed or performed during the hospital encounter of 10/01/20  Urinalysis, Complete w Microscopic  Result Value Ref Range   Color, Urine YELLOW YELLOW   APPearance CLEAR CLEAR   Specific Gravity, Urine 1.025 1.005 - 1.030   pH 5.5 5.0 - 8.0   Glucose, UA NEGATIVE NEGATIVE mg/dL   Hgb urine dipstick TRACE (A) NEGATIVE   Bilirubin Urine NEGATIVE NEGATIVE   Ketones, ur NEGATIVE NEGATIVE mg/dL   Protein, ur NEGATIVE NEGATIVE mg/dL   Nitrite NEGATIVE NEGATIVE   Leukocytes,Ua NEGATIVE NEGATIVE   Squamous Epithelial / LPF 0-5 0 - 5   WBC, UA 0-5 0 - 5 WBC/hpf   RBC / HPF 0-5 0 - 5 RBC/hpf   Bacteria, UA MANY (A) NONE SEEN   Mucus PRESENT    Hyaline Casts,  UA PRESENT     Pertinent Imaging:  Abdomen 1 view (KUB)  Narrative CLINICAL DATA:  Right low back pain. History of urinary tract stones.  EXAM: ABDOMEN - 1 VIEW  COMPARISON:  None.  FINDINGS: No evidence of right urinary tract stone is identified. There is a 0.8 cm calcification projecting over the mid to lower pole of the left kidney compatible with a nonobstructing stone. Bowel gas pattern is normal. No acute or focal bony abnormality.  IMPRESSION: Findings compatible with a 0.8 cm nonobstructing stone in the left kidney. The exam is otherwise negative.   Electronically Signed By: Drusilla Kanner M.D. On: 10/04/2020 09:33  KUB was personally reviewed today in the patient's room.  He was also able to review this personally.  No obvious ureteral stone was seen.   Assessment & Plan:    1. Kidney stones He is extremely concerned about the 8 mm left nonobstructing stone  We did discuss today that if this does pass, I would likely be with some difficulty or he may require urgent intervention.  He is interested in treating this prior to becoming an emergency which is quite reasonable given the size of the stone.  We discussed various treatment options for urolithiasis including observation with or without medical expulsive therapy, shockwave lithotripsy (SWL), ureteroscopy and laser lithotripsy with stent placement, and percutaneous nephrolithotomy.   We discussed that management is based on stone size, location, density, patient co-morbidities, and patient preference.    Stones <4mm in size have a >80% spontaneous passage rate. Data surrounding the use of tamsulosin for medical expulsive therapy is controversial, but meta analyses suggests it is most efficacious for distal stones between 5-20mm in size. Possible side effects include dizziness/lightheadedness, and retrograde ejaculation.   SWL has a lower stone free rate in a single procedure, but also a lower complication  rate compared to ureteroscopy and avoids a stent and associated stent related symptoms. Possible complications include renal hematoma, steinstrasse, and need for additional treatment. We discussed the role of his increased skin to stone distance can lead to decreased efficacy with shockwave lithotripsy.   Ureteroscopy with laser lithotripsy and stent placement has a higher stone free rate than SWL in a single procedure, however increased complication rate including possible infection, ureteral injury, bleeding, and stent related morbidity. Common stent related symptoms include dysuria, urgency/frequency, and flank pain.   After an extensive discussion of the risks and benefits of the above treatment options, the patient would like to proceed with ESWL  Prior to completing this, he is still having some right flank pain.  Its important to ensure that he is passed his  contralateral ureteral stone which is obstructing.  He would like to have his procedure done on Thursday and thus we will order a stat noncontrast CT scan to assess for interval passage of the right stone and its its not clearly seen on KUB.  Agreeable this plan.  We will call him the results.  - Urinalysis, Complete w Microscopic; Future - Urine Culture; Future  2. Right flank pain As above - CT RENAL STONE STUDY  3. Renal lesion RUS pending     Vanna Scotland, MD  [facility] [address] [place], [place] [address] [phone]

## 2020-10-18 ENCOUNTER — Other Ambulatory Visit: Payer: Self-pay

## 2020-10-18 DIAGNOSIS — N200 Calculus of kidney: Secondary | ICD-10-CM

## 2020-10-21 ENCOUNTER — Encounter
Admission: RE | Disposition: A | Discharge status: Home / Self Care | Payer: Self-pay | Source: Home / Self Care | Attending: Urology

## 2020-10-21 ENCOUNTER — Ambulatory Visit: Payer: Commercial Managed Care - PPO

## 2020-10-21 ENCOUNTER — Encounter: Payer: Self-pay | Admitting: Urology

## 2020-10-21 ENCOUNTER — Ambulatory Visit
Admission: RE | Admit: 2020-10-21 | Discharge: 2020-10-21 | Disposition: A | Discharge status: Home / Self Care | Payer: Commercial Managed Care - PPO | Attending: Urology | Admitting: Urology

## 2020-10-21 ENCOUNTER — Other Ambulatory Visit: Payer: Self-pay

## 2020-10-21 DIAGNOSIS — I10 Essential (primary) hypertension: Secondary | ICD-10-CM | POA: Insufficient documentation

## 2020-10-21 DIAGNOSIS — G4730 Sleep apnea, unspecified: Secondary | ICD-10-CM | POA: Insufficient documentation

## 2020-10-21 DIAGNOSIS — N200 Calculus of kidney: Secondary | ICD-10-CM | POA: Diagnosis not present

## 2020-10-21 DIAGNOSIS — N281 Cyst of kidney, acquired: Secondary | ICD-10-CM | POA: Diagnosis not present

## 2020-10-21 DIAGNOSIS — Z79899 Other long term (current) drug therapy: Secondary | ICD-10-CM | POA: Insufficient documentation

## 2020-10-21 DIAGNOSIS — R109 Unspecified abdominal pain: Secondary | ICD-10-CM | POA: Diagnosis not present

## 2020-10-21 HISTORY — PX: EXTRACORPOREAL SHOCK WAVE LITHOTRIPSY: SHX1557

## 2020-10-21 SURGERY — LITHOTRIPSY, ESWL
Anesthesia: Moderate Sedation | Laterality: Left

## 2020-10-21 MED ORDER — CEPHALEXIN 500 MG PO CAPS: 500.0000 mg | ORAL_CAPSULE | Freq: Once | ORAL | Status: AC

## 2020-10-21 MED ORDER — DIPHENHYDRAMINE HCL 25 MG PO CAPS: 25.0000 mg | ORAL_CAPSULE | ORAL | Status: AC

## 2020-10-21 MED ORDER — HYDROCODONE-ACETAMINOPHEN 5-325 MG PO TABS: 1.0000 | ORAL_TABLET | Freq: Four times a day (QID) | ORAL | 0 refills | Status: DC | PRN

## 2020-10-21 MED ORDER — DIPHENHYDRAMINE HCL 25 MG PO CAPS
ORAL_CAPSULE | ORAL | Status: AC
  Administered 2020-10-21: 25 mg via ORAL
  Filled 2020-10-21: qty 1

## 2020-10-21 MED ORDER — SODIUM CHLORIDE 0.9 % IV SOLN
INTRAVENOUS | Status: DC
Start: 2020-10-21 — End: 2020-10-21

## 2020-10-21 MED ORDER — ONDANSETRON HCL 4 MG/2ML IJ SOLN
INTRAMUSCULAR | Status: AC
  Administered 2020-10-21: 4 mg via INTRAVENOUS
  Filled 2020-10-21: qty 2

## 2020-10-21 MED ORDER — DIAZEPAM 5 MG PO TABS
ORAL_TABLET | ORAL | Status: AC
  Administered 2020-10-21: 10 mg via ORAL
  Filled 2020-10-21: qty 2

## 2020-10-21 MED ORDER — CEPHALEXIN 500 MG PO CAPS
ORAL_CAPSULE | ORAL | Status: AC
  Administered 2020-10-21: 500 mg via ORAL
  Filled 2020-10-21: qty 1

## 2020-10-21 MED ORDER — ONDANSETRON HCL 4 MG/2ML IJ SOLN: 4.0000 mg | Freq: Once | INTRAMUSCULAR | Status: AC

## 2020-10-21 MED ORDER — DIAZEPAM 5 MG PO TABS
10.0000 mg | ORAL_TABLET | ORAL | Status: AC
Start: 2020-10-21 — End: 2020-10-21

## 2020-10-21 MED ORDER — TAMSULOSIN HCL 0.4 MG PO CAPS: 0.4000 mg | ORAL_CAPSULE | Freq: Every day | ORAL | 0 refills | Status: DC

## 2020-10-21 NOTE — Interval H&P Note (Signed)
History and Physical Interval Note:  10/21/2020 11:14 AM  [name]  has presented today for surgery, with the diagnosis of Kidney stone.  The various methods of treatment have been discussed with the patient and family. After consideration of risks, benefits and other options for treatment, the patient has consented to  Procedure(s): EXTRACORPOREAL SHOCK WAVE LITHOTRIPSY (ESWL) (Left) as a surgical intervention.  The patient's history has been reviewed, patient examined, no change in status, stable for surgery.  I have reviewed the patient's chart and labs.  Questions were answered to the patient's satisfaction.    RRR ctab   Kirk Mueller

## 2020-10-21 NOTE — Discharge Instructions (Signed)
See [facility] discharge instructions in chart.  AMBULATORY SURGERY  DISCHARGE INSTRUCTIONS   1) The drugs that you were given will stay in your system until tomorrow so for the next 24 hours you should not:  A) Drive an automobile B) Make any legal decisions C) Drink any alcoholic beverage   2) You may resume regular meals tomorrow.  Today it is better to start with liquids and gradually work up to solid foods.  You may eat anything you prefer, but it is better to start with liquids, then soup and crackers, and gradually work up to solid foods.   3) Please notify your doctor immediately if you have any unusual bleeding, trouble breathing, redness and pain at the surgery site, drainage, fever, or pain not relieved by medication.    4) Additional Instructions:        Please contact your physician with any problems or Same Day Surgery at [phone], Monday through Friday 6 am to 4 pm, or [place] [facility] number at [phone].  

## 2020-10-22 ENCOUNTER — Other Ambulatory Visit: Payer: Self-pay

## 2020-10-22 ENCOUNTER — Emergency Department (HOSPITAL_COMMUNITY)
Admission: EM | Admit: 2020-10-22 | Discharge: 2020-10-22 | Disposition: A | Discharge status: Home / Self Care | Payer: Commercial Managed Care - PPO | Attending: Emergency Medicine | Admitting: Emergency Medicine

## 2020-10-22 ENCOUNTER — Encounter (HOSPITAL_COMMUNITY): Payer: Self-pay | Admitting: Emergency Medicine

## 2020-10-22 DIAGNOSIS — E860 Dehydration: Secondary | ICD-10-CM | POA: Diagnosis not present

## 2020-10-22 DIAGNOSIS — Z79899 Other long term (current) drug therapy: Secondary | ICD-10-CM | POA: Diagnosis not present

## 2020-10-22 DIAGNOSIS — N200 Calculus of kidney: Secondary | ICD-10-CM | POA: Insufficient documentation

## 2020-10-22 DIAGNOSIS — I10 Essential (primary) hypertension: Secondary | ICD-10-CM | POA: Diagnosis not present

## 2020-10-22 DIAGNOSIS — K219 Gastro-esophageal reflux disease without esophagitis: Secondary | ICD-10-CM | POA: Diagnosis not present

## 2020-10-22 DIAGNOSIS — R109 Unspecified abdominal pain: Secondary | ICD-10-CM | POA: Diagnosis present

## 2020-10-22 LAB — URINALYSIS, ROUTINE W REFLEX MICROSCOPIC
Bilirubin Urine: NEGATIVE
Glucose, UA: NEGATIVE mg/dL
Ketones, ur: 5 mg/dL — AB
Leukocytes,Ua: NEGATIVE
Nitrite: NEGATIVE
Protein, ur: 100 mg/dL — AB
RBC / HPF: >50 RBC/hpf — ABNORMAL HIGH (ref 0–5)
Specific Gravity, Urine: 1.027 (ref 1.005–1.030)
pH: 6 (ref 5.0–8.0)

## 2020-10-22 LAB — CBC WITH DIFFERENTIAL/PLATELET
Abs Immature Granulocytes: 0.06 10*3/uL (ref 0.00–0.07)
Basophils Absolute: 0 10*3/uL (ref 0.0–0.1)
Basophils Relative: 0 %
Eosinophils Absolute: 0 10*3/uL (ref 0.0–0.5)
Eosinophils Relative: 0 %
HCT: 44.5 % (ref 39.0–52.0)
Hemoglobin: 15.9 g/dL (ref 13.0–17.0)
Immature Granulocytes: 1 %
Lymphocytes Relative: 6 %
Lymphs Abs: 0.7 10*3/uL (ref 0.7–4.0)
MCH: 31.5 pg (ref 26.0–34.0)
MCHC: 35.7 g/dL (ref 30.0–36.0)
MCV: 88.1 fL (ref 80.0–100.0)
Monocytes Absolute: 0.5 10*3/uL (ref 0.1–1.0)
Monocytes Relative: 4 %
Neutro Abs: 11.4 10*3/uL — ABNORMAL HIGH (ref 1.7–7.7)
Neutrophils Relative %: 89 %
Platelets: 195 10*3/uL (ref 150–400)
RBC: 5.05 MIL/uL (ref 4.22–5.81)
RDW: 12.5 % (ref 11.5–15.5)
WBC: 12.7 10*3/uL — ABNORMAL HIGH (ref 4.0–10.5)
nRBC: 0 % (ref 0.0–0.2)

## 2020-10-22 LAB — BASIC METABOLIC PANEL
Anion gap: 10 (ref 5–15)
BUN: 19 mg/dL (ref 6–20)
CO2: 23 mmol/L (ref 22–32)
Calcium: 9.1 mg/dL (ref 8.9–10.3)
Chloride: 101 mmol/L (ref 98–111)
Creatinine, Ser: 1.3 mg/dL — ABNORMAL HIGH (ref 0.61–1.24)
GFR, Estimated: >60 mL/min (ref >60)
Glucose, Bld: 155 mg/dL — ABNORMAL HIGH (ref 70–99)
Potassium: 3.8 mmol/L (ref 3.5–5.1)
Sodium: 134 mmol/L — ABNORMAL LOW (ref 135–145)

## 2020-10-22 MED ORDER — ONDANSETRON HCL 4 MG/2ML IJ SOLN
4.0000 mg | Freq: Once | INTRAMUSCULAR | Status: AC
  Administered 2020-10-22: 4 mg via INTRAVENOUS
  Filled 2020-10-22: qty 2

## 2020-10-22 MED ORDER — SODIUM CHLORIDE 0.9 % IV BOLUS
1000.0000 mL | Freq: Once | INTRAVENOUS | Status: AC
  Administered 2020-10-22: 1000 mL via INTRAVENOUS

## 2020-10-22 MED ORDER — KETOROLAC TROMETHAMINE 30 MG/ML IJ SOLN
30.0000 mg | Freq: Once | INTRAMUSCULAR | Status: AC
  Administered 2020-10-22: 30 mg via INTRAVENOUS
  Filled 2020-10-22: qty 1

## 2020-10-22 MED ORDER — ONDANSETRON 8 MG PO TBDP: 8.0000 mg | ORAL_TABLET | Freq: Three times a day (TID) | ORAL | 0 refills | Status: DC | PRN

## 2020-10-22 MED ORDER — SODIUM CHLORIDE 0.9 % IV BOLUS (SEPSIS)
1000.0000 mL | Freq: Once | INTRAVENOUS | Status: AC
  Administered 2020-10-22: 1000 mL via INTRAVENOUS

## 2020-10-22 MED ORDER — MORPHINE SULFATE (PF) 4 MG/ML IV SOLN
4.0000 mg | Freq: Once | INTRAVENOUS | Status: AC
  Administered 2020-10-22: 4 mg via INTRAVENOUS
  Filled 2020-10-22: qty 1

## 2020-10-22 MED ORDER — FENTANYL CITRATE (PF) 100 MCG/2ML IJ SOLN
100.0000 ug | Freq: Once | INTRAMUSCULAR | Status: AC
  Administered 2020-10-22: 100 ug via INTRAVENOUS
  Filled 2020-10-22: qty 2

## 2020-10-22 NOTE — ED Notes (Signed)
Bladder scan 45 ml

## 2020-10-22 NOTE — ED Provider Notes (Signed)
[facility] EMERGENCY DEPARTMENT Provider Note   CSN: [id number] Arrival date & time: 10/22/20  9735     History Chief Complaint  Patient presents with  . Flank Pain    Kirk Mueller is a 60 y.o. male.  The history is provided by the patient.  Flank Pain This is a new problem. Episode onset: Just prior to arrival. The problem occurs constantly. The problem has been rapidly worsening. Associated symptoms include abdominal pain. Pertinent negatives include no chest pain and no shortness of breath. Nothing aggravates the symptoms. Nothing relieves the symptoms.  Patient just underwent lithotripsy by urology in [place].  He had a large stone in his left kidney.  The procedure went well, but after going home his pain increased.  He began having multiple episodes of vomiting and worsening pain.  Patient reports now the pain radiates from his left flank down into his testicles.  No fevers are reported.  Patient reports he was given antibiotics during the procedure     Past Medical History:  Diagnosis Date  . Arthritis   . Complication of anesthesia    headache, very active when came out of anesthesia   . GERD (gastroesophageal reflux disease)   . Headache   . Heart murmur    as a teenager   . Hypercholesteremia   . Hypertension     Patient Active Problem List   Diagnosis Date Noted  . Pain in right knee 06/09/2019  . Acute pain of right wrist 06/11/2017  . Lateral epicondylitis 06/11/2017  . HNP (herniated nucleus pulposus), lumbar 05/17/2016  . Spinal stenosis of lumbar region 05/17/2016    Past Surgical History:  Procedure Laterality Date  . brown recluse bite    . COLONOSCOPY    . EXTRACORPOREAL SHOCK WAVE LITHOTRIPSY Left 10/21/2020   Procedure: EXTRACORPOREAL SHOCK WAVE LITHOTRIPSY (ESWL);  Surgeon: Vanna Scotland, MD;  Location: [facility] ORS;  Service: Urology;  Laterality: Left;  . KNEE ARTHROSCOPY  right  . left leg surgery    . LUMBAR LAMINECTOMY/DECOMPRESSION  MICRODISCECTOMY N/A 05/17/2016   Procedure: MICRO LUMBER DECOMPRESSION L4-5, L5-S1;  Surgeon: Jene Every, MD;  Location: [facility];  Service: Orthopedics;  Laterality: N/A;       History reviewed. No pertinent family history.  Social History   Tobacco Use  . Smoking status: Never Smoker  . Smokeless tobacco: Never Used  Substance Use Topics  . Alcohol use: No  . Drug use: No    Home Medications Prior to Admission medications   Medication Sig Start Date End Date Taking? Authorizing Provider  HYDROcodone-acetaminophen (NORCO/VICODIN) 5-325 MG tablet Take 1-2 tablets by mouth every 6 (six) hours as needed for moderate pain. 10/21/20   Vanna Scotland, MD  loratadine (CLARITIN) 10 MG tablet Take 10 mg by mouth daily.    [provider]  metoprolol tartrate (LOPRESSOR) 25 MG tablet Take 12.5 mg by mouth 2 (two) times daily.    [provider]  rosuvastatin (CRESTOR) 5 MG tablet Take 5 mg by mouth daily. 10/14/19   [provider]  SUMAtriptan (IMITREX) 50 MG tablet  08/29/19   [provider]  tamsulosin (FLOMAX) 0.4 MG CAPS capsule Take 1 capsule (0.4 mg total) by mouth daily. 10/21/20   Vanna Scotland, MD    Allergies    Other and Tuberculin purified protein derivative  Review of Systems   Review of Systems  Constitutional: Negative for fever.  Respiratory: Negative for shortness of breath.   Cardiovascular: Negative for  chest pain.  Gastrointestinal: Positive for abdominal pain, nausea and vomiting.  Genitourinary: Positive for flank pain and testicular pain.  Psychiatric/Behavioral: The patient is nervous/anxious.   All other systems reviewed and are negative.   Physical Exam Updated Vital Signs BP (!) 149/77 (BP Location: Left Arm)   Pulse 62   Temp (!) 97.4 F (36.3 C) (Oral)   Resp 18   Ht 1.753 m (5\' 9" )   Wt 92.5 kg   SpO2 100%   BMI 30.13 kg/m   Physical Exam CONSTITUTIONAL: Well developed/well nourished, uncomfortable  appearing HEAD: Normocephalic/atraumatic EYES: EOMI/PERRL ENMT: Mucous membranes moist NECK: supple no meningeal signs SPINE/BACK:entire spine nontender CV: S1/S2 noted, no murmurs/rubs/gallops noted LUNGS: Lungs are clear to auscultation bilaterally, no apparent distress ABDOMEN: soft, nontender, no rebound or guarding, bowel sounds noted throughout abdomen GU: Markings from lithotripsy noted to left lower There is no inguinal hernia Both testicles are descended bilaterally.  No scrotal/testicular tenderness or edema is noted. Spouse at bedside at patient request NEURO: Pt is awake/alert/appropriate, moves all extremitiesx4.  No facial droop.   EXTREMITIES: pulses normal/equal, full ROM SKIN: warm, color normal PSYCH: anxious  ED Results / Procedures / Treatments   Labs (all labs ordered are listed, but only abnormal results are displayed) Labs Reviewed  BASIC METABOLIC PANEL - Abnormal; Notable for the following components:      Result Value   Sodium 134 (*)    Glucose, Bld 155 (*)    Creatinine, Ser 1.30 (*)    All other components within normal limits  CBC WITH DIFFERENTIAL/PLATELET - Abnormal; Notable for the following components:   WBC 12.7 (*)    Neutro Abs 11.4 (*)    All other components within normal limits  URINALYSIS, ROUTINE W REFLEX MICROSCOPIC - Abnormal; Notable for the following components:   APPearance CLOUDY (*)    Hgb urine dipstick LARGE (*)    Ketones, ur 5 (*)    Protein, ur 100 (*)    RBC / HPF >50 (*)    Bacteria, UA RARE (*)    All other components within normal limits  URINE CULTURE    EKG None  Radiology DG Abd 1 View  Result Date: 10/21/2020 CLINICAL DATA:  Lithotripsy. EXAM: ABDOMEN - 1 VIEW COMPARISON:  CT 10/15/2020.  Abdomen 10/01/2020. FINDINGS: Soft tissue structures are unremarkable. Left nephrolithiasis again noted. No interim change. No evidence of urolithiasis. No acute bony abnormality. IMPRESSION: Left nephrolithiasis again  noted. No interim change. No evidence of urolithiasis. Electronically Signed   By: 10/03/2020  Register   On: 10/21/2020 10:12    Procedures Procedures   Medications Ordered in ED Medications  sodium chloride 0.9 % bolus 1,000 mL (1,000 mLs Intravenous New Bag/Given 10/22/20 0559)  fentaNYL (SUBLIMAZE) injection 100 mcg (100 mcg Intravenous Given 10/22/20 0345)  ondansetron (ZOFRAN) injection 4 mg (4 mg Intravenous Given 10/22/20 0345)  sodium chloride 0.9 % bolus 1,000 mL (0 mLs Intravenous Stopped 10/22/20 0541)  morphine 4 MG/ML injection 4 mg (4 mg Intravenous Given 10/22/20 0600)  ondansetron (ZOFRAN) injection 4 mg (4 mg Intravenous Given 10/22/20 0600)  ketorolac (TORADOL) 30 MG/ML injection 30 mg (30 mg Intravenous Given 10/22/20 0601)    ED Course  I have reviewed the triage vital signs and the nursing notes.  Pertinent labs results that were available during my care of the patient were reviewed by me and considered in my medical decision making (see chart for details).    MDM Rules/Calculators/A&P  6:53 AM Patient presented after undergoing lithotripsy yesterday. Patient has significant pain from his left flank into his testicles.  Patient was given multiple medications and he is now pain-free.  Patient is afebrile, no acute distress.  Patient was dehydrated and given IV fluids. No convincing signs of urinary tract infection. Patient feels comfortable for discharge home.  We will add on Zofran as needed for nausea and vomiting He will consult his urologist later today due to ER visit Imaging not indicated at this time since patient is improving Final Clinical Impression(s) / ED Diagnoses Final diagnoses:  Kidney stone    Rx / DC Orders ED Discharge Orders         Ordered    ondansetron (ZOFRAN ODT) 8 MG disintegrating tablet  Every 8 hours PRN        10/22/20 9924           Zadie Rhine, MD 10/22/20 [id number]

## 2020-10-22 NOTE — ED Notes (Signed)
Pt placed on 2L [place] per request. O2 100% on room air prior to O2 placement.  Pt now stating that he feels much better with the extra O2. Will continue to monitor.

## 2020-10-22 NOTE — ED Triage Notes (Signed)
Pt had lithotripsy yesterday at [facility] by MD Apolinar Junes. Pt began to have severe pain about midnight tonight, pt tried to take PO medications for pain and nausea but was unable to keep anything down.

## 2020-10-24 ENCOUNTER — Encounter (HOSPITAL_COMMUNITY)
Admission: EM | Disposition: A | Discharge status: Home / Self Care | Payer: Self-pay | Source: Home / Self Care | Attending: Emergency Medicine

## 2020-10-24 ENCOUNTER — Emergency Department (HOSPITAL_COMMUNITY): Payer: Commercial Managed Care - PPO | Admitting: Certified Registered Nurse Anesthetist

## 2020-10-24 ENCOUNTER — Other Ambulatory Visit: Payer: Self-pay

## 2020-10-24 ENCOUNTER — Ambulatory Visit (HOSPITAL_COMMUNITY)
Admission: EM | Admit: 2020-10-24 | Discharge: 2020-10-25 | Disposition: A | Discharge status: Home / Self Care | Payer: Commercial Managed Care - PPO | Attending: Emergency Medicine | Admitting: Emergency Medicine

## 2020-10-24 ENCOUNTER — Encounter: Payer: Self-pay | Admitting: Urology

## 2020-10-24 ENCOUNTER — Emergency Department (HOSPITAL_COMMUNITY): Payer: Commercial Managed Care - PPO

## 2020-10-24 ENCOUNTER — Encounter (HOSPITAL_COMMUNITY): Payer: Self-pay | Admitting: Emergency Medicine

## 2020-10-24 DIAGNOSIS — N132 Hydronephrosis with renal and ureteral calculous obstruction: Secondary | ICD-10-CM | POA: Insufficient documentation

## 2020-10-24 DIAGNOSIS — Z419 Encounter for procedure for purposes other than remedying health state, unspecified: Secondary | ICD-10-CM

## 2020-10-24 DIAGNOSIS — R52 Pain, unspecified: Secondary | ICD-10-CM

## 2020-10-24 DIAGNOSIS — N23 Unspecified renal colic: Secondary | ICD-10-CM

## 2020-10-24 DIAGNOSIS — Z79899 Other long term (current) drug therapy: Secondary | ICD-10-CM | POA: Insufficient documentation

## 2020-10-24 DIAGNOSIS — Z20822 Contact with and (suspected) exposure to covid-19: Secondary | ICD-10-CM | POA: Insufficient documentation

## 2020-10-24 DIAGNOSIS — N35912 Unspecified bulbous urethral stricture, male: Secondary | ICD-10-CM | POA: Diagnosis not present

## 2020-10-24 DIAGNOSIS — Z87442 Personal history of urinary calculi: Secondary | ICD-10-CM | POA: Insufficient documentation

## 2020-10-24 DIAGNOSIS — N1330 Unspecified hydronephrosis: Secondary | ICD-10-CM | POA: Diagnosis not present

## 2020-10-24 DIAGNOSIS — N200 Calculus of kidney: Secondary | ICD-10-CM | POA: Diagnosis not present

## 2020-10-24 HISTORY — PX: CYSTOSCOPY W/ URETERAL STENT PLACEMENT: SHX1429

## 2020-10-24 LAB — CBC WITH DIFFERENTIAL/PLATELET
Abs Immature Granulocytes: 0.05 10*3/uL (ref 0.00–0.07)
Basophils Absolute: 0 10*3/uL (ref 0.0–0.1)
Basophils Relative: 0 %
Eosinophils Absolute: 0.1 10*3/uL (ref 0.0–0.5)
Eosinophils Relative: 1 %
HCT: 40.8 % (ref 39.0–52.0)
Hemoglobin: 14 g/dL (ref 13.0–17.0)
Immature Granulocytes: 1 %
Lymphocytes Relative: 8 %
Lymphs Abs: 0.8 10*3/uL (ref 0.7–4.0)
MCH: 30.8 pg (ref 26.0–34.0)
MCHC: 34.3 g/dL (ref 30.0–36.0)
MCV: 89.7 fL (ref 80.0–100.0)
Monocytes Absolute: 1.3 10*3/uL — ABNORMAL HIGH (ref 0.1–1.0)
Monocytes Relative: 12 %
Neutro Abs: 8.5 10*3/uL — ABNORMAL HIGH (ref 1.7–7.7)
Neutrophils Relative %: 78 %
Platelets: 143 10*3/uL — ABNORMAL LOW (ref 150–400)
RBC: 4.55 MIL/uL (ref 4.22–5.81)
RDW: 12.5 % (ref 11.5–15.5)
WBC: 10.8 10*3/uL — ABNORMAL HIGH (ref 4.0–10.5)
nRBC: 0 % (ref 0.0–0.2)

## 2020-10-24 LAB — COMPREHENSIVE METABOLIC PANEL
ALT: 22 U/L (ref 0–44)
AST: 18 U/L (ref 15–41)
Albumin: 3.3 g/dL — ABNORMAL LOW (ref 3.5–5.0)
Alkaline Phosphatase: 49 U/L (ref 38–126)
Anion gap: 6 (ref 5–15)
BUN: 13 mg/dL (ref 6–20)
CO2: 24 mmol/L (ref 22–32)
Calcium: 8.7 mg/dL — ABNORMAL LOW (ref 8.9–10.3)
Chloride: 107 mmol/L (ref 98–111)
Creatinine, Ser: 1.84 mg/dL — ABNORMAL HIGH (ref 0.61–1.24)
GFR, Estimated: 41 mL/min — ABNORMAL LOW (ref >60)
Glucose, Bld: 106 mg/dL — ABNORMAL HIGH (ref 70–99)
Potassium: 3.5 mmol/L (ref 3.5–5.1)
Sodium: 137 mmol/L (ref 135–145)
Total Bilirubin: 1.7 mg/dL — ABNORMAL HIGH (ref 0.3–1.2)
Total Protein: 5.8 g/dL — ABNORMAL LOW (ref 6.5–8.1)

## 2020-10-24 LAB — URINALYSIS, ROUTINE W REFLEX MICROSCOPIC
Bacteria, UA: NONE SEEN
Bilirubin Urine: NEGATIVE
Glucose, UA: NEGATIVE mg/dL
Ketones, ur: 20 mg/dL — AB
Leukocytes,Ua: NEGATIVE
Nitrite: NEGATIVE
Protein, ur: NEGATIVE mg/dL
Specific Gravity, Urine: 1.013 (ref 1.005–1.030)
pH: 5 (ref 5.0–8.0)

## 2020-10-24 LAB — URINE CULTURE: Culture: NO GROWTH

## 2020-10-24 LAB — RESP PANEL BY RT-PCR (FLU A&B, COVID) ARPGX2
Influenza A by PCR: NEGATIVE
Influenza B by PCR: NEGATIVE
SARS Coronavirus 2 by RT PCR: NEGATIVE

## 2020-10-24 SURGERY — CYSTOSCOPY, WITH RETROGRADE PYELOGRAM AND URETERAL STENT INSERTION
Anesthesia: General | Site: Pelvis | Laterality: Left

## 2020-10-24 MED ORDER — ONDANSETRON HCL 4 MG/2ML IJ SOLN
4.0000 mg | Freq: Once | INTRAMUSCULAR | Status: AC
  Administered 2020-10-24: 4 mg via INTRAVENOUS
  Filled 2020-10-24: qty 2

## 2020-10-24 MED ORDER — HYDROMORPHONE HCL 1 MG/ML IJ SOLN
1.0000 mg | Freq: Once | INTRAMUSCULAR | Status: AC
  Administered 2020-10-24: 1 mg via INTRAVENOUS
  Filled 2020-10-24: qty 1

## 2020-10-24 MED ORDER — LIDOCAINE 2% (20 MG/ML) 5 ML SYRINGE
INTRAMUSCULAR | Status: AC
  Filled 2020-10-24: qty 5

## 2020-10-24 MED ORDER — CEFAZOLIN SODIUM 1 G IJ SOLR
INTRAMUSCULAR | Status: AC
  Filled 2020-10-24: qty 20

## 2020-10-24 MED ORDER — SUCCINYLCHOLINE CHLORIDE 20 MG/ML IJ SOLN
INTRAMUSCULAR | Status: DC | PRN
  Administered 2020-10-24: 100 mg via INTRAVENOUS

## 2020-10-24 MED ORDER — FENTANYL CITRATE (PF) 100 MCG/2ML IJ SOLN: 25.0000 ug | INTRAMUSCULAR | Status: DC | PRN

## 2020-10-24 MED ORDER — FENTANYL CITRATE (PF) 100 MCG/2ML IJ SOLN
INTRAMUSCULAR | Status: DC | PRN
  Administered 2020-10-24: 50 ug via INTRAVENOUS
  Administered 2020-10-24: 100 ug via INTRAVENOUS

## 2020-10-24 MED ORDER — EPHEDRINE SULFATE 50 MG/ML IJ SOLN
INTRAMUSCULAR | Status: DC | PRN
  Administered 2020-10-24: 5 mg via INTRAVENOUS

## 2020-10-24 MED ORDER — CEFAZOLIN SODIUM-DEXTROSE 2-3 GM-%(50ML) IV SOLR
INTRAVENOUS | Status: DC | PRN
  Administered 2020-10-24: 2 g via INTRAVENOUS

## 2020-10-24 MED ORDER — BELLADONNA ALKALOIDS-OPIUM 16.2-30 MG RE SUPP: 1.0000 | Freq: Once | RECTAL | Status: DC

## 2020-10-24 MED ORDER — WATER FOR IRRIGATION, STERILE IR SOLN
Status: DC | PRN
  Administered 2020-10-24: 3000 mL

## 2020-10-24 MED ORDER — IOHEXOL 300 MG/ML  SOLN
INTRAMUSCULAR | Status: DC | PRN
  Administered 2020-10-24: 8 mL via URETHRAL

## 2020-10-24 MED ORDER — PROPOFOL 10 MG/ML IV BOLUS
INTRAVENOUS | Status: DC | PRN
  Administered 2020-10-24: 120 mg via INTRAVENOUS

## 2020-10-24 MED ORDER — ONDANSETRON HCL 4 MG/2ML IJ SOLN
INTRAMUSCULAR | Status: DC | PRN
  Administered 2020-10-24: 4 mg via INTRAVENOUS

## 2020-10-24 MED ORDER — SUCCINYLCHOLINE CHLORIDE 200 MG/10ML IV SOSY
PREFILLED_SYRINGE | INTRAVENOUS | Status: AC
  Filled 2020-10-24: qty 10

## 2020-10-24 MED ORDER — LIDOCAINE HCL (CARDIAC) PF 100 MG/5ML IV SOSY
PREFILLED_SYRINGE | INTRAVENOUS | Status: DC | PRN
  Administered 2020-10-24: 40 mg via INTRAVENOUS

## 2020-10-24 MED ORDER — PROPOFOL 10 MG/ML IV BOLUS
INTRAVENOUS | Status: AC
  Filled 2020-10-24: qty 20

## 2020-10-24 MED ORDER — LIDOCAINE HCL URETHRAL/MUCOSAL 2 % EX GEL
1.0000 "application " | Freq: Once | CUTANEOUS | Status: DC
  Filled 2020-10-24: qty 11

## 2020-10-24 MED ORDER — MIDAZOLAM HCL 2 MG/2ML IJ SOLN
INTRAMUSCULAR | Status: AC
  Filled 2020-10-24: qty 2

## 2020-10-24 MED ORDER — MIDAZOLAM HCL 5 MG/5ML IJ SOLN
INTRAMUSCULAR | Status: DC | PRN
  Administered 2020-10-24: 2 mg via INTRAVENOUS

## 2020-10-24 MED ORDER — MORPHINE SULFATE (PF) 4 MG/ML IV SOLN
4.0000 mg | Freq: Once | INTRAVENOUS | Status: AC
Start: 2020-10-24 — End: 2020-10-24
  Administered 2020-10-24: 4 mg via INTRAVENOUS
  Filled 2020-10-24: qty 1

## 2020-10-24 MED ORDER — FENTANYL CITRATE (PF) 250 MCG/5ML IJ SOLN
INTRAMUSCULAR | Status: AC
  Filled 2020-10-24: qty 5

## 2020-10-24 MED ORDER — SODIUM CHLORIDE 0.9 % IV BOLUS
1000.0000 mL | Freq: Once | INTRAVENOUS | Status: AC
  Administered 2020-10-24: 1000 mL via INTRAVENOUS

## 2020-10-24 MED ORDER — DEXAMETHASONE SODIUM PHOSPHATE 10 MG/ML IJ SOLN
INTRAMUSCULAR | Status: DC | PRN
  Administered 2020-10-24: 5 mg via INTRAVENOUS

## 2020-10-24 MED ORDER — SODIUM CHLORIDE 0.9 % IV SOLN: INTRAVENOUS | Status: DC | PRN

## 2020-10-24 MED ORDER — BELLADONNA ALKALOIDS-OPIUM 16.2-60 MG RE SUPP: 1.0000 | Freq: Once | RECTAL | Status: DC

## 2020-10-24 MED ORDER — 0.9 % SODIUM CHLORIDE (POUR BTL) OPTIME
TOPICAL | Status: DC | PRN
  Administered 2020-10-24: 1000 mL

## 2020-10-24 MED ORDER — KETOROLAC TROMETHAMINE 30 MG/ML IJ SOLN
30.0000 mg | Freq: Once | INTRAMUSCULAR | Status: AC
  Administered 2020-10-24: 30 mg via INTRAVENOUS
  Filled 2020-10-24: qty 1

## 2020-10-24 MED ORDER — CEPHALEXIN 500 MG PO CAPS: 500.0000 mg | ORAL_CAPSULE | Freq: Four times a day (QID) | ORAL | 0 refills | Status: AC

## 2020-10-24 SURGICAL SUPPLY — 22 items
BAG URINE DRAIN 2000ML AR STRL (UROLOGICAL SUPPLIES) ×2 IMPLANT
BAG URO CATCHER STRL LF (MISCELLANEOUS) ×2 IMPLANT
CATH FOLEY 2WAY SLVR  5CC 16FR (CATHETERS)
CATH FOLEY 2WAY SLVR 5CC 16FR (CATHETERS) IMPLANT
CATH URET 5FR 28IN OPEN ENDED (CATHETERS) ×2 IMPLANT
GLOVE BIO SURGEON STRL SZ 6.5 (GLOVE) ×2 IMPLANT
GOWN STRL REUS W/ TWL LRG LVL3 (GOWN DISPOSABLE) ×1 IMPLANT
GOWN STRL REUS W/ TWL XL LVL3 (GOWN DISPOSABLE) ×1 IMPLANT
GOWN STRL REUS W/TWL LRG LVL3 (GOWN DISPOSABLE) ×2
GOWN STRL REUS W/TWL XL LVL3 (GOWN DISPOSABLE) ×2
GUIDEWIRE ANG ZIPWIRE 038X150 (WIRE) ×2 IMPLANT
GUIDEWIRE STR DUAL SENSOR (WIRE) ×2 IMPLANT
KIT TURNOVER KIT B (KITS) ×2 IMPLANT
MANIFOLD NEPTUNE II (INSTRUMENTS) ×2 IMPLANT
NS IRRIG 1000ML POUR BTL (IV SOLUTION) ×2 IMPLANT
PACK CYSTO (CUSTOM PROCEDURE TRAY) ×2 IMPLANT
STENT URET 6FRX24 CONTOUR (STENTS) IMPLANT
STENT URET 6FRX26 CONTOUR (STENTS) ×2 IMPLANT
SYPHON OMNI JUG (MISCELLANEOUS) ×2 IMPLANT
TOWEL GREEN STERILE FF (TOWEL DISPOSABLE) ×2 IMPLANT
TUBE CONNECTING 12X1/4 (SUCTIONS) ×2 IMPLANT
WATER STERILE IRR 3000ML UROMA (IV SOLUTION) ×2 IMPLANT

## 2020-10-24 NOTE — Interval H&P Note (Signed)
History and Physical Interval Note:  10/24/2020 10:01 PM  Kirk Mueller  has presented today for surgery, with the diagnosis of Left Kidney Stones.  The various methods of treatment have been discussed with the patient and family. After consideration of risks, benefits and other options for treatment, the patient has consented to  Procedure(s): CYSTOSCOPY WITH RETROGRADE PYELOGRAM/URETERAL STENT PLACEMENT (Left) as a surgical intervention.  The patient's history has been reviewed, patient examined, no change in status, stable for surgery.  I have reviewed the patient's chart and labs.  Questions were answered to the patient's satisfaction.     Danice Dippolito D Raydin Bielinski

## 2020-10-24 NOTE — Transfer of Care (Signed)
Immediate Anesthesia Transfer of Care Note  Patient: Kirk Mueller  Procedure(s) Performed: CYSTOSCOPY WITH RETROGRADE PYELOGRAM/URETERAL STENT PLACEMENT (Left Pelvis)  Patient Location: PACU  Anesthesia Type:General  Level of Consciousness: awake, alert  and oriented  Airway & Oxygen Therapy: Patient Spontanous Breathing and Patient connected to nasal cannula oxygen  Post-op Assessment: Report given to RN, Post -op Vital signs reviewed and stable and Patient moving all extremities  Post vital signs: Reviewed and stable  Last Vitals:  Vitals Value Taken Time  BP 125/95 10/24/20 2316  Temp    Pulse 105 10/24/20 2317  Resp 23 10/24/20 2317  SpO2 90 % 10/24/20 2317  Vitals shown include unvalidated device data.  Last Pain:  Vitals:   10/24/20 2122  TempSrc: Oral  PainSc:          Complications: No complications documented.

## 2020-10-24 NOTE — Anesthesia Preprocedure Evaluation (Addendum)
Anesthesia Evaluation  Patient identified by MRN, date of birth, ID band Patient awake    Reviewed: Allergy & Precautions, H&P , NPO status , Patient's Chart, lab work & pertinent test results, reviewed documented beta blocker date and time   Airway Mallampati: II  TM Distance: >3 FB Neck ROM: Full    Dental no notable dental hx. (+) Teeth Intact, Dental Advisory Given   Pulmonary neg pulmonary ROS,    Pulmonary exam normal breath sounds clear to auscultation       Cardiovascular hypertension, Pt. on medications and Pt. on home beta blockers  Rhythm:Regular Rate:Normal     Neuro/Psych  Headaches, negative psych ROS   GI/Hepatic Neg liver ROS, GERD  ,  Endo/Other  negative endocrine ROS  Renal/GU negative Renal ROS  negative genitourinary   Musculoskeletal  (+) Arthritis , Osteoarthritis,    Abdominal   Peds  Hematology negative hematology ROS (+)   Anesthesia Other Findings   Reproductive/Obstetrics negative OB ROS                            Anesthesia Physical Anesthesia Plan  ASA: II  Anesthesia Plan: General   Post-op Pain Management:    Induction: Intravenous  PONV Risk Score and Plan: 3 and Ondansetron, Dexamethasone and Midazolam  Airway Management Planned: Oral ETT  Additional Equipment:   Intra-op Plan:   Post-operative Plan: Extubation in OR  Informed Consent: I have reviewed the patients History and Physical, chart, labs and discussed the procedure including the risks, benefits and alternatives for the proposed anesthesia with the patient or authorized representative who has indicated his/her understanding and acceptance.     Dental advisory given  Plan Discussed with: CRNA  Anesthesia Plan Comments:        Anesthesia Quick Evaluation

## 2020-10-24 NOTE — H&P (View-Only) (Signed)
I have been asked to see the patient by Dr. Jacalyn Lefevre, for evaluation and management of left ureteral pain s/p ESWL.  History of present illness:60 yo man with a history of nephrolithiasis who underwent ESWL on 10/21/2020 for an 8 mm nonobstructing left calculus.    Since then, patient has been to the emergency room 3 times for uncontrolled pain.  Lab work and urinalysis without signs of infection.  Most recent imaging was a noncontrast CT scan at the [facility] ED that showed 3 mm left mid ureteral calculi as well as small UVJ calculi.  Impression is listed below.  Patient does not feel he can control his pain at home.  No fevers or chills.  Urinalysis is negative for signs of infection.   IMPRESSION: 1. Interval development of three consecutive obstructive proximal/mid left ureteral stones measuring up to 3 mm with associated perinephric and periureteral fat stranding. Correlate with urinalysis for infection. 2. Couple of nonobstructive punctate left ureterovesicular junction stones. 3. Residual punctate left nephrolithiasis with previously identified 7 mm calcified stone no longer visualized in a patient status post Lithotripsy.   Review of systems: A 12 point comprehensive review of systems was obtained and is negative unless otherwise stated in the history of present illness.  Patient Active Problem List   Diagnosis Date Noted  . Pain in right knee 06/09/2019  . Acute pain of right wrist 06/11/2017  . Lateral epicondylitis 06/11/2017  . HNP (herniated nucleus pulposus), lumbar 05/17/2016  . Spinal stenosis of lumbar region 05/17/2016    No current facility-administered medications on file prior to encounter.   Current Outpatient Medications on File Prior to Encounter  Medication Sig Dispense Refill  . HYDROcodone-acetaminophen (NORCO/VICODIN) 5-325 MG tablet Take 1-2 tablets by mouth every 6 (six) hours as needed for moderate pain. 10 tablet 0  . loratadine (CLARITIN) 10 MG  tablet Take 10 mg by mouth daily.    . metoprolol tartrate (LOPRESSOR) 25 MG tablet Take 12.5 mg by mouth 2 (two) times daily.    . ondansetron (ZOFRAN ODT) 8 MG disintegrating tablet Take 1 tablet (8 mg total) by mouth every 8 (eight) hours as needed for nausea or vomiting. 8 tablet 0  . rosuvastatin (CRESTOR) 5 MG tablet Take 5 mg by mouth daily.    . SUMAtriptan (IMITREX) 50 MG tablet     . tamsulosin (FLOMAX) 0.4 MG CAPS capsule Take 1 capsule (0.4 mg total) by mouth daily. 30 capsule 0    Past Medical History:  Diagnosis Date  . Arthritis   . Complication of anesthesia    headache, very active when came out of anesthesia   . GERD (gastroesophageal reflux disease)   . Headache   . Heart murmur    as a teenager   . Hypercholesteremia   . Hypertension     Past Surgical History:  Procedure Laterality Date  . brown recluse bite    . COLONOSCOPY    . EXTRACORPOREAL SHOCK WAVE LITHOTRIPSY Left 10/21/2020   Procedure: EXTRACORPOREAL SHOCK WAVE LITHOTRIPSY (ESWL);  Surgeon: Vanna Scotland, MD;  Location: [facility] ORS;  Service: Urology;  Laterality: Left;  . KNEE ARTHROSCOPY  right  . left leg surgery    . LUMBAR LAMINECTOMY/DECOMPRESSION MICRODISCECTOMY N/A 05/17/2016   Procedure: MICRO LUMBER DECOMPRESSION L4-5, L5-S1;  Surgeon: Jene Every, MD;  Location: [facility] ORS;  Service: Orthopedics;  Laterality: N/A;    Social History   Tobacco Use  . Smoking status: Never Smoker  . Smokeless tobacco: Never Used  Substance Use Topics  . Alcohol use: No  . Drug use: No    History reviewed. No pertinent family history.  PE: Vitals:   10/24/20 1831 10/24/20 1900 10/24/20 1915 10/24/20 1930  BP: (!) 145/79 (!) 159/87 (!) 150/105 (!) 154/90  Pulse: 76 73 74 82  Resp: 20 18    Temp: 98.3 F (36.8 C)     SpO2: 96% 97% 100% 99%   Patient appears to be in no acute distress  patient is alert and oriented x3 Atraumatic normocephalic head No increased work of breathing, no audible  wheezes/rhonchi Regular sinus rhythm/rate Abdomen is soft, nontender, nondistended Lower extremities are symmetric without appreciable edema Grossly neurologically intact No identifiable skin lesions  Recent Labs    10/22/20 0341 10/24/20 1838  WBC 12.7* 10.8*  HGB 15.9 14.0  HCT 44.5 40.8   Recent Labs    10/22/20 0341 10/24/20 1838  NA 134* 137  K 3.8 3.5  CL 101 107  CO2 23 24  GLUCOSE 155* 106*  BUN 19 13  CREATININE 1.30* 1.84*  CALCIUM 9.1 8.7*   No results for input(s): LABPT, INR in the last 72 hours. No results for input(s): LABURIN in the last 72 hours. Results for orders placed or performed during the hospital encounter of 10/22/20  Urine culture     Status: None   Collection Time: 10/22/20  3:42 AM   Specimen: Urine, Clean Catch  Result Value Ref Range Status   Specimen Description   Final    URINE, CLEAN CATCH Performed at [facility], [address], [place], [place] [address]    Special Requests   Final    NONE Performed at [facility], [address], [place], [place] [address]    Culture   Final    NO GROWTH Performed at [facility], [address] [place], [place] [address]    Report Status 10/24/2020 FINAL  Final    Imaging: IMPRESSION: 1. Interval development of three consecutive obstructive proximal/mid left ureteral stones measuring up to 3 mm with associated perinephric and periureteral fat stranding. Correlate with urinalysis for infection. 2. Couple of nonobstructive punctate left ureterovesicular junction stones. 3. Residual punctate left nephrolithiasis with previously identified 7 mm calcified stone no longer visualized in a patient status post Lithotripsy.   Imp/Recommendations: 60 year old man with an 8 mm nonobstructing renal calculus status post ESWL 10/21/2020 with uncontrolled pain secondary to passage of stone fragments.  -Discussed size of stone fragments which should pass on their own with patient; however,  patient's pain is uncontrollable and he would like to proceed with intervention -Discussed placing a left ureteral stent as a temporizing measure -Risks and benefits of left ureteral stent were discussed with patient in detail including but not limited to pain, infection, hematuria, damage to surrounding structures, overactive bladder, inability to place stent, need for future treatment -Patient to go to surgery this evening for stent placement -He will then follow-up with Dr. Apolinar Junes to discuss remaining stone fragments  Thank you for involving me in this patient's care.  Please page with any further questions or concerns. Tytan Sandate D Daud Cayer

## 2020-10-24 NOTE — ED Provider Notes (Signed)
[facility] EMERGENCY DEPARTMENT Provider Note   CSN: [id number] Arrival date & time: 10/24/20  1807     History Chief Complaint  Patient presents with  . Nephrolithiasis    Kirk Mueller is a 60 y.o. male.  Pt presents to the ED today with severe left flank pain.  The pt had a lithotripsy on 5/19 by Dr. Apolinar Junes.  He has had severe pain since then.  He was seen in the ED at [facility] on 5/20 and treated with IV pain meds and antiemetics.  He went the the ED in [facility] yesterday due to the pain.  He had a Cr up to 2.1 and a CT scan which showed 3 obstructive proximal/mid left ureteral stones.  Pt was d/c home and is still in severe pain.  He has not had any more vomiting with the zofran, but feels nauseous.        Past Medical History:  Diagnosis Date  . Arthritis   . Complication of anesthesia    headache, very active when came out of anesthesia   . GERD (gastroesophageal reflux disease)   . Headache   . Heart murmur    as a teenager   . Hypercholesteremia   . Hypertension     Patient Active Problem List   Diagnosis Date Noted  . Pain in right knee 06/09/2019  . Acute pain of right wrist 06/11/2017  . Lateral epicondylitis 06/11/2017  . HNP (herniated nucleus pulposus), lumbar 05/17/2016  . Spinal stenosis of lumbar region 05/17/2016    Past Surgical History:  Procedure Laterality Date  . brown recluse bite    . COLONOSCOPY    . EXTRACORPOREAL SHOCK WAVE LITHOTRIPSY Left 10/21/2020   Procedure: EXTRACORPOREAL SHOCK WAVE LITHOTRIPSY (ESWL);  Surgeon: Vanna Scotland, MD;  Location: [facility] ORS;  Service: Urology;  Laterality: Left;  . KNEE ARTHROSCOPY  right  . left leg surgery    . LUMBAR LAMINECTOMY/DECOMPRESSION MICRODISCECTOMY N/A 05/17/2016   Procedure: MICRO LUMBER DECOMPRESSION L4-5, L5-S1;  Surgeon: Jene Every, MD;  Location: [facility];  Service: Orthopedics;  Laterality: N/A;       History reviewed. No pertinent family history.  Social History    Tobacco Use  . Smoking status: Never Smoker  . Smokeless tobacco: Never Used  Substance Use Topics  . Alcohol use: No  . Drug use: No    Home Medications Prior to Admission medications   Medication Sig Start Date End Date Taking? Authorizing Provider  HYDROcodone-acetaminophen (NORCO/VICODIN) 5-325 MG tablet Take 1-2 tablets by mouth every 6 (six) hours as needed for moderate pain. 10/21/20   Vanna Scotland, MD  loratadine (CLARITIN) 10 MG tablet Take 10 mg by mouth daily.    [provider]  metoprolol tartrate (LOPRESSOR) 25 MG tablet Take 12.5 mg by mouth 2 (two) times daily.    [provider]  ondansetron (ZOFRAN ODT) 8 MG disintegrating tablet Take 1 tablet (8 mg total) by mouth every 8 (eight) hours as needed for nausea or vomiting. 10/22/20   Zadie Rhine, MD  rosuvastatin (CRESTOR) 5 MG tablet Take 5 mg by mouth daily. 10/14/19   [provider]  SUMAtriptan (IMITREX) 50 MG tablet  08/29/19   [provider]  tamsulosin (FLOMAX) 0.4 MG CAPS capsule Take 1 capsule (0.4 mg total) by mouth daily. 10/21/20   Vanna Scotland, MD    Allergies    Other and Tuberculin purified protein derivative  Review of Systems   Review of Systems  Gastrointestinal: Positive for abdominal pain and nausea.  Genitourinary: Positive for flank pain and testicular pain.  All other systems reviewed and are negative.   Physical Exam Updated Vital Signs BP 137/71   Pulse 77   Temp 98.3 F (36.8 C)   Resp 18   SpO2 97%   Physical Exam Vitals and nursing note reviewed.  Constitutional:      Appearance: Normal appearance. He is ill-appearing.  HENT:     Head: Normocephalic and atraumatic.     Right Ear: External ear normal.     Left Ear: External ear normal.     Nose: Nose normal.     Mouth/Throat:     Mouth: Mucous membranes are dry.  Eyes:     Extraocular Movements: Extraocular movements intact.     Conjunctiva/sclera: Conjunctivae normal.      Pupils: Pupils are equal, round, and reactive to light.  Cardiovascular:     Rate and Rhythm: Normal rate and regular rhythm.     Pulses: Normal pulses.     Heart sounds: Normal heart sounds.  Pulmonary:     Effort: Pulmonary effort is normal.     Breath sounds: Normal breath sounds.  Abdominal:     General: Abdomen is flat. Bowel sounds are normal.     Palpations: Abdomen is soft.  Musculoskeletal:        General: Normal range of motion.     Cervical back: Normal range of motion and neck supple.  Skin:    General: Skin is warm.     Capillary Refill: Capillary refill takes less than 2 seconds.  Neurological:     General: No focal deficit present.     Mental Status: He is alert and oriented to person, place, and time.  Psychiatric:        Mood and Affect: Mood normal.        Behavior: Behavior normal.        Thought Content: Thought content normal.        Judgment: Judgment normal.     ED Results / Procedures / Treatments   Labs (all labs ordered are listed, but only abnormal results are displayed) Labs Reviewed  CBC WITH DIFFERENTIAL/PLATELET - Abnormal; Notable for the following components:      Result Value   WBC 10.8 (*)    Platelets 143 (*)    Neutro Abs 8.5 (*)    Monocytes Absolute 1.3 (*)    All other components within normal limits  COMPREHENSIVE METABOLIC PANEL - Abnormal; Notable for the following components:   Glucose, Bld 106 (*)    Creatinine, Ser 1.84 (*)    Calcium 8.7 (*)    Total Protein 5.8 (*)    Albumin 3.3 (*)    Total Bilirubin 1.7 (*)    GFR, Estimated 41 (*)    All other components within normal limits  URINALYSIS, ROUTINE W REFLEX MICROSCOPIC - Abnormal; Notable for the following components:   Hgb urine dipstick SMALL (*)    Ketones, ur 20 (*)    All other components within normal limits  RESP PANEL BY RT-PCR (FLU A&B, COVID) ARPGX2    EKG None  Radiology No results found.  Procedures Procedures   Medications Ordered in  ED Medications  sodium chloride 0.9 % bolus 1,000 mL (0 mLs Intravenous Stopped 10/24/20 2042)  ondansetron (ZOFRAN) injection 4 mg (4 mg Intravenous Given 10/24/20 1938)  morphine 4 MG/ML injection 4 mg (4 mg Intravenous Given 10/24/20 1937)  HYDROmorphone (  DILAUDID) injection 1 mg (1 mg Intravenous Given 10/24/20 2010)  ketorolac (TORADOL) 30 MG/ML injection 30 mg (30 mg Intravenous Given 10/24/20 2021)    ED Course  I have reviewed the triage vital signs and the nursing notes.  Pertinent labs & imaging results that were available during my care of the patient were reviewed by me and considered in my medical decision making (see chart for details).    MDM Rules/Calculators/A&P                         I reviewed his CT scan and labs.  Cr is better than yesterday, but is still more than his baseline.  Pt is still in terrible pain after meds.  I spoke with Dr. Arita Miss who will take him to the OR for a stent.     Final Clinical Impression(s) / ED Diagnoses Final diagnoses:  Renal colic on left side  Intractable pain    Rx / DC Orders ED Discharge Orders    None       Jacalyn Lefevre, MD 10/24/20 2048

## 2020-10-24 NOTE — Op Note (Signed)
Preoperative diagnosis:  1. Left ureteral calculus   Postoperative diagnosis:  1. Left ureteral calculus  Procedure:  1. Cystoscopy 2. left ureteral stent placement -6 French by 24 cm JJ stent no tether 3. left retrograde pyelography with interpretation   Surgeon: Kasandra Knudsen, MD  Anesthesia: General  Complications: None  Intraoperative findings:   1.  Patient was noted to have proximal hydronephrosis 2.  Mild resistance at UVJ when placing wire consistent with obstructing stone fragments 3.  Wide bore thin bulbar urethral stricture 4.  6 French by 24 cm JJ stent-no tether  EBL: Minimal  Specimens: None  Indication: Kirk Mueller is a 60 y.o. patient with 8 mm nonobstructing left renal calculus who underwent ESWL on 10/21/2020 who subsequently developed severe left renal colic associated with passage of stone fragments.  Repeat imaging showed 3 mm fragments in the proximal left ureter with hydronephrosis as well as UVJ fragments.  After reviewing the management options for treatment, he elected to proceed with the above surgical procedure(s). We have discussed the potential benefits and risks of the procedure, side effects of the proposed treatment, the likelihood of the patient achieving the goals of the procedure, and any potential problems that might occur during the procedure or recuperation. Informed consent has been obtained.  Description of procedure:  The patient was taken to the operating room and general anesthesia was induced.  The patient was placed in the dorsal lithotomy position, prepped and draped in the usual sterile fashion, and preoperative antibiotics were administered. A preoperative time-out was performed.   Cystourethroscopy was performed.  The patient's urethra was examined and he was noted to have a bulbar urethral stricture that was not intense and could be traversed with gentle pressure with the cystoscope. The bladder was then systematically examined in  its entirety. There was no evidence for any bladder tumors, stones, or other mucosal pathology.    Attention then turned to the leftureteral orifice and a ureteral catheter was used to intubate the ureteral orifice.  Omnipaque contrast was injected through the ureteral catheter and a retrograde pyelogram was performed with findings as dictated above.  A 0.38 sensor guidewire was then advanced up the left ureter into the renal pelvis under fluoroscopic guidance.  The ureteral stent was advance over the wire using Seldinger technique.  The stent was positioned appropriately under fluoroscopic and cystoscopic guidance.  The wire was then removed with an adequate stent curl noted in the renal pelvis as well as in the bladder.  The bladder was then emptied and the procedure ended.  The patient appeared to tolerate the procedure well and without complications.  The patient was able to be awakened and transferred to the recovery unit in satisfactory condition.   Follow-up: The patient was discharged home with antibiotics and instructions to follow-up with [facility].  He understands that this is a temporizing measure and the stent will need to be removed at a future date.   Kasandra Knudsen, M.D.

## 2020-10-24 NOTE — ED Provider Notes (Signed)
Emergency Medicine Provider Triage Evaluation Note  Kirk Mueller , a 60 y.o. male  was evaluated in triage.  Pt complains of severe left flank pain. Patient had a left lithotripsy performed on 5/19 by Dr. Apolinar Junes and reported to the ED on 5/20 due to severe pain where he was treated with pain medication and discharged with symptomatic treatment. Patient notes pain got so severe last night/early this morning that he went to [facility] ED where a CT scan was performed which demonstrated:  IMPRESSION: 1. Interval development of three consecutive obstructive proximal/mid left ureteral stones measuring up to 3 mm with associated perinephric and periureteral fat stranding. Correlate with urinalysis for infection. 2. Couple of nonobstructive punctate left ureterovesicular junction stones. 3. Residual punctate left nephrolithiasis with previously identified 7 mm calcified stone no longer visualized in a patient status post Lithotripsy.  Patient is still producing urine. He has been taking tylenol with no relief.  Review of Systems  Positive: Flank pain Negative: CP  Physical Exam  There were no vitals taken for this visit. Gen:   Awake, no distress   Resp:  Normal effort  MSK:   Moves extremities without difficulty  Other:  Left CVA tenderness  Medical Decision Making  Medically screening exam initiated at 6:23 PM.  Appropriate orders placed.  [name] was informed that the remainder of the evaluation will be completed by another provider, this initial triage assessment does not replace that evaluation, and the importance of remaining in the ED until their evaluation is complete.  Flank pain after recent lithotripsy. Labs and UA ordered.    Jesusita Oka 10/24/20 1830    Jacalyn Lefevre, MD 10/24/20 [phone]

## 2020-10-24 NOTE — Discharge Instructions (Signed)

## 2020-10-24 NOTE — ED Notes (Signed)
Patient assisted to bathroom in wheelchair, provided specimen cup for urine sample.

## 2020-10-24 NOTE — ED Triage Notes (Signed)
Pt from home c/o kidney stones that are getting worse. Pt had lithotripsy on 19th and not experiencing relief.

## 2020-10-24 NOTE — ED Notes (Signed)
Warm blankets given to pt and visitor

## 2020-10-24 NOTE — Consult Note (Signed)
I have been asked to see the patient by Dr. Jacalyn Lefevre, for evaluation and management of left ureteral pain s/p ESWL.  History of present illness:60 yo man with a history of nephrolithiasis who underwent ESWL on 10/21/2020 for an 8 mm nonobstructing left calculus.    Since then, patient has been to the emergency room 3 times for uncontrolled pain.  Lab work and urinalysis without signs of infection.  Most recent imaging was a noncontrast CT scan at the [facility] ED that showed 3 mm left mid ureteral calculi as well as small UVJ calculi.  Impression is listed below.  Patient does not feel he can control his pain at home.  No fevers or chills.  Urinalysis is negative for signs of infection.   IMPRESSION: 1. Interval development of three consecutive obstructive proximal/mid left ureteral stones measuring up to 3 mm with associated perinephric and periureteral fat stranding. Correlate with urinalysis for infection. 2. Couple of nonobstructive punctate left ureterovesicular junction stones. 3. Residual punctate left nephrolithiasis with previously identified 7 mm calcified stone no longer visualized in a patient status post Lithotripsy.   Review of systems: A 12 point comprehensive review of systems was obtained and is negative unless otherwise stated in the history of present illness.  Patient Active Problem List   Diagnosis Date Noted  . Pain in right knee 06/09/2019  . Acute pain of right wrist 06/11/2017  . Lateral epicondylitis 06/11/2017  . HNP (herniated nucleus pulposus), lumbar 05/17/2016  . Spinal stenosis of lumbar region 05/17/2016    No current facility-administered medications on file prior to encounter.   Current Outpatient Medications on File Prior to Encounter  Medication Sig Dispense Refill  . HYDROcodone-acetaminophen (NORCO/VICODIN) 5-325 MG tablet Take 1-2 tablets by mouth every 6 (six) hours as needed for moderate pain. 10 tablet 0  . loratadine (CLARITIN) 10 MG  tablet Take 10 mg by mouth daily.    . metoprolol tartrate (LOPRESSOR) 25 MG tablet Take 12.5 mg by mouth 2 (two) times daily.    . ondansetron (ZOFRAN ODT) 8 MG disintegrating tablet Take 1 tablet (8 mg total) by mouth every 8 (eight) hours as needed for nausea or vomiting. 8 tablet 0  . rosuvastatin (CRESTOR) 5 MG tablet Take 5 mg by mouth daily.    . SUMAtriptan (IMITREX) 50 MG tablet     . tamsulosin (FLOMAX) 0.4 MG CAPS capsule Take 1 capsule (0.4 mg total) by mouth daily. 30 capsule 0    Past Medical History:  Diagnosis Date  . Arthritis   . Complication of anesthesia    headache, very active when came out of anesthesia   . GERD (gastroesophageal reflux disease)   . Headache   . Heart murmur    as a teenager   . Hypercholesteremia   . Hypertension     Past Surgical History:  Procedure Laterality Date  . brown recluse bite    . COLONOSCOPY    . EXTRACORPOREAL SHOCK WAVE LITHOTRIPSY Left 10/21/2020   Procedure: EXTRACORPOREAL SHOCK WAVE LITHOTRIPSY (ESWL);  Surgeon: Vanna Scotland, MD;  Location: [facility] ORS;  Service: Urology;  Laterality: Left;  . KNEE ARTHROSCOPY  right  . left leg surgery    . LUMBAR LAMINECTOMY/DECOMPRESSION MICRODISCECTOMY N/A 05/17/2016   Procedure: MICRO LUMBER DECOMPRESSION L4-5, L5-S1;  Surgeon: Jene Every, MD;  Location: [facility] ORS;  Service: Orthopedics;  Laterality: N/A;    Social History   Tobacco Use  . Smoking status: Never Smoker  . Smokeless tobacco: Never Used  Substance Use Topics  . Alcohol use: No  . Drug use: No    History reviewed. No pertinent family history.  PE: Vitals:   10/24/20 1831 10/24/20 1900 10/24/20 1915 10/24/20 1930  BP: (!) 145/79 (!) 159/87 (!) 150/105 (!) 154/90  Pulse: 76 73 74 82  Resp: 20 18    Temp: 98.3 F (36.8 C)     SpO2: 96% 97% 100% 99%   Patient appears to be in no acute distress  patient is alert and oriented x3 Atraumatic normocephalic head No increased work of breathing, no audible  wheezes/rhonchi Regular sinus rhythm/rate Abdomen is soft, nontender, nondistended Lower extremities are symmetric without appreciable edema Grossly neurologically intact No identifiable skin lesions  Recent Labs    10/22/20 0341 10/24/20 1838  WBC 12.7* 10.8*  HGB 15.9 14.0  HCT 44.5 40.8   Recent Labs    10/22/20 0341 10/24/20 1838  NA 134* 137  K 3.8 3.5  CL 101 107  CO2 23 24  GLUCOSE 155* 106*  BUN 19 13  CREATININE 1.30* 1.84*  CALCIUM 9.1 8.7*   No results for input(s): LABPT, INR in the last 72 hours. No results for input(s): LABURIN in the last 72 hours. Results for orders placed or performed during the hospital encounter of 10/22/20  Urine culture     Status: None   Collection Time: 10/22/20  3:42 AM   Specimen: Urine, Clean Catch  Result Value Ref Range Status   Specimen Description   Final    URINE, CLEAN CATCH Performed at [facility], [address] [place], [place] [address]    Special Requests   Final    NONE Performed at [facility], [address] [place], [place] [address]    Culture   Final    NO GROWTH Performed at [facility], [address] [place], [place] [address]    Report Status 10/24/2020 FINAL  Final    Imaging: IMPRESSION: 1. Interval development of three consecutive obstructive proximal/mid left ureteral stones measuring up to 3 mm with associated perinephric and periureteral fat stranding. Correlate with urinalysis for infection. 2. Couple of nonobstructive punctate left ureterovesicular junction stones. 3. Residual punctate left nephrolithiasis with previously identified 7 mm calcified stone no longer visualized in a patient status post Lithotripsy.   Imp/Recommendations: 60-year-old man with an 8 mm nonobstructing renal calculus status post ESWL 10/21/2020 with uncontrolled pain secondary to passage of stone fragments.  -Discussed size of stone fragments which should pass on their own with patient; however,  patient's pain is uncontrollable and he would like to proceed with intervention -Discussed placing a left ureteral stent as a temporizing measure -Risks and benefits of left ureteral stent were discussed with patient in detail including but not limited to pain, infection, hematuria, damage to surrounding structures, overactive bladder, inability to place stent, need for future treatment -Patient to go to surgery this evening for stent placement -He will then follow-up with Dr. Brandon to discuss remaining stone fragments  Thank you for involving me in this patient's care.  Please page with any further questions or concerns. Inayah Woodin D Janice Bodine  

## 2020-10-24 NOTE — Anesthesia Procedure Notes (Signed)
Procedure Name: Intubation Date/Time: 10/24/2020 10:34 PM Performed by: Nyssa Sayegh T, CRNA Pre-anesthesia Checklist: Patient identified, Emergency Drugs available, Suction available and Patient being monitored Patient Re-evaluated:Patient Re-evaluated prior to induction Oxygen Delivery Method: Circle system utilized Preoxygenation: Pre-oxygenation with 100% oxygen Induction Type: IV induction Ventilation: Mask ventilation without difficulty Laryngoscope Size: Mac and 4 Grade View: Grade II Tube type: Oral Tube size: 7.5 mm Number of attempts: 1 Airway Equipment and Method: Stylet and Oral airway Placement Confirmation: ETT inserted through vocal cords under direct vision,  positive ETCO2 and breath sounds checked- equal and bilateral Secured at: 21 cm Tube secured with: Tape Dental Injury: Teeth and Oropharynx as per pre-operative assessment

## 2020-10-25 ENCOUNTER — Emergency Department: Payer: Self-pay

## 2020-10-25 ENCOUNTER — Ambulatory Visit (HOSPITAL_COMMUNITY): Payer: Commercial Managed Care - PPO

## 2020-10-25 ENCOUNTER — Other Ambulatory Visit (HOSPITAL_COMMUNITY): Payer: Self-pay | Admitting: Urology

## 2020-10-25 ENCOUNTER — Encounter (HOSPITAL_COMMUNITY): Payer: Self-pay | Admitting: Urology

## 2020-10-25 DIAGNOSIS — N132 Hydronephrosis with renal and ureteral calculous obstruction: Secondary | ICD-10-CM | POA: Diagnosis not present

## 2020-10-25 DIAGNOSIS — Z419 Encounter for procedure for purposes other than remedying health state, unspecified: Secondary | ICD-10-CM

## 2020-10-25 DIAGNOSIS — Z9889 Other specified postprocedural states: Secondary | ICD-10-CM

## 2020-10-25 NOTE — Anesthesia Postprocedure Evaluation (Signed)
Anesthesia Post Note  Patient: Kirk Mueller  Procedure(s) Performed: CYSTOSCOPY WITH RETROGRADE PYELOGRAM/URETERAL STENT PLACEMENT (Left Pelvis)     Patient location during evaluation: PACU Anesthesia Type: General Level of consciousness: awake and alert Pain management: pain level controlled Vital Signs Assessment: post-procedure vital signs reviewed and stable Respiratory status: spontaneous breathing, nonlabored ventilation and respiratory function stable Cardiovascular status: blood pressure returned to baseline and stable Postop Assessment: no apparent nausea or vomiting Anesthetic complications: no   No complications documented.  Last Vitals:  Vitals:   10/25/20 0015 10/25/20 0030  BP: (!) 141/99 (!) 134/91  Pulse: 83 88  Resp: 16 20  Temp: 36.8 C 36.8 C  SpO2: 95% 92%    Last Pain:  Vitals:   10/25/20 0030  TempSrc:   PainSc: 0-No pain                 Jeri Jeanbaptiste,W. EDMOND

## 2020-10-26 ENCOUNTER — Telehealth: Payer: Self-pay | Admitting: *Deleted

## 2020-10-26 NOTE — Telephone Encounter (Signed)
Spoke with patient-scheduled virtual appointment for 10/29/20-aware of time and details.

## 2020-10-27 NOTE — Telephone Encounter (Signed)
See Mychart notes

## 2020-10-29 ENCOUNTER — Ambulatory Visit (INDEPENDENT_AMBULATORY_CARE_PROVIDER_SITE_OTHER): Payer: Commercial Managed Care - PPO | Admitting: Urology

## 2020-10-29 ENCOUNTER — Other Ambulatory Visit: Payer: Self-pay

## 2020-10-29 ENCOUNTER — Other Ambulatory Visit: Payer: Self-pay | Admitting: Urology

## 2020-10-29 DIAGNOSIS — N201 Calculus of ureter: Secondary | ICD-10-CM

## 2020-10-29 DIAGNOSIS — N200 Calculus of kidney: Secondary | ICD-10-CM

## 2020-10-29 NOTE — Progress Notes (Signed)
Virtual Visit via Video Note  I connected with Kirk Mueller on 10/29/20 at  8:45 AM EDT by a video enabled telemedicine application and verified that I am speaking with the correct person using two identifiers.  Location: Patient: home Provider: office   I discussed the limitations of evaluation and management by telemedicine and the availability of in person appointments. The patient expressed understanding and agreed to proceed.  History of Present Illness: 60-year-old male with a personal history of nephrolithiasis who presents today for further discussion of management of left ureteral fragments and stent.  He underwent ESWL 8 days ago for an 8 mm nonobstructing stone.  Over the weekend, he struggled with pain/discomfort from his ureteral fragments of which are fairly small.  There is also concern for possible infection for which he was prescribed Keflex and is still taking.  Ultimately, he underwent ureteral stent placement by Dr. Arita Miss in [place].  He was able to be discharged the same day.  His pain has improved significantly.  He is only having to take about 1 narcotic pill per day.  He denies any fevers or chills.  He is having some stent discomfort but it is tolerable.  He is anxious to become stone free and get the stent out.   Observations/Objective: Appears well  Assessment and Plan:  1. Left ureteral stone Status post left ESWL complicated by obstruction from stone fragments up to 3 mm  He status post ureteral stent  We discussed various options on stent management today including removing the stent in the office with anticipation that the small residual fragments will likely pass based on the size versus proceeding to the operating room for cystoscopy, ureteroscopy, laser lithotripsy and/or basket extraction of the stone fragment and potential ureteral stent exchange based on the degree of ureteral edema and trauma.  He is most interested in the latter.  He is anxious  to become stone free and get the stent out.  We will plan to proceed with ureteroscopy next week.  Risks and benefits of ureteroscopy were reviewed including but not limited to infection, bleeding, pain, ureteral injury which could require open surgery versus prolonged indwelling if ureteral perforation occurs, persistent stone disease, requirement for staged procedure, possible stent, and global anesthesia risks. Patient expressed understanding and desires to proceed with ureteroscopy.   Follow Up Instructions Schedule surgery   I discussed the assessment and treatment plan with the patient. The patient was provided an opportunity to ask questions and all were answered. The patient agreed with the plan and demonstrated an understanding of the instructions.   The patient was advised to call back or seek an in-person evaluation if the symptoms worsen or if the condition fails to improve as anticipated.  I provided 16 minutes of non-face-to-face time during this encounter.   Vanna Scotland, MD

## 2020-10-29 NOTE — Progress Notes (Signed)
This service is provided via telemedicine   No vital signs collected/recorded due to the encounter was a telemedicine visit.     Patient consents to a telephone visit:  yes    Names of all persons participating in the telemedicine service and their role in the encounter:  Yaiza Palazzola, CMA and Ashley Brandon, MD   

## 2020-10-29 NOTE — H&P (View-Only) (Signed)
Virtual Visit via Video Note  I connected with Kirk Mueller on 10/29/20 at  8:45 AM EDT by a video enabled telemedicine application and verified that I am speaking with the correct person using two identifiers.  Location: Patient: home Provider: office   I discussed the limitations of evaluation and management by telemedicine and the availability of in person appointments. The patient expressed understanding and agreed to proceed.  History of Present Illness: 60-year-old male with a personal history of nephrolithiasis who presents today for further discussion of management of left ureteral fragments and stent.  He underwent ESWL 8 days ago for an 8 mm nonobstructing stone.  Over the weekend, he struggled with pain/discomfort from his ureteral fragments of which are fairly small.  There is also concern for possible infection for which he was prescribed Keflex and is still taking.  Ultimately, he underwent ureteral stent placement by Dr. Pace in [place].  He was able to be discharged the same day.  His pain has improved significantly.  He is only having to take about 1 narcotic pill per day.  He denies any fevers or chills.  He is having some stent discomfort but it is tolerable.  He is anxious to become stone free and get the stent out.   Observations/Objective: Appears well  Assessment and Plan:  1. Left ureteral stone Status post left ESWL complicated by obstruction from stone fragments up to 3 mm  He status post ureteral stent  We discussed various options on stent management today including removing the stent in the office with anticipation that the small residual fragments will likely pass based on the size versus proceeding to the operating room for cystoscopy, ureteroscopy, laser lithotripsy and/or basket extraction of the stone fragment and potential ureteral stent exchange based on the degree of ureteral edema and trauma.  He is most interested in the latter.  He is anxious  to become stone free and get the stent out.  We will plan to proceed with ureteroscopy next week.  Risks and benefits of ureteroscopy were reviewed including but not limited to infection, bleeding, pain, ureteral injury which could require open surgery versus prolonged indwelling if ureteral perforation occurs, persistent stone disease, requirement for staged procedure, possible stent, and global anesthesia risks. Patient expressed understanding and desires to proceed with ureteroscopy.   Follow Up Instructions Schedule surgery   I discussed the assessment and treatment plan with the patient. The patient was provided an opportunity to ask questions and all were answered. The patient agreed with the plan and demonstrated an understanding of the instructions.   The patient was advised to call back or seek an in-person evaluation if the symptoms worsen or if the condition fails to improve as anticipated.  I provided 16 minutes of non-face-to-face time during this encounter.   Eshani Maestre, MD   

## 2020-11-03 ENCOUNTER — Other Ambulatory Visit: Payer: Self-pay

## 2020-11-03 ENCOUNTER — Other Ambulatory Visit
Admission: RE | Admit: 2020-11-03 | Discharge: 2020-11-03 | Disposition: A | Discharge status: Home / Self Care | Payer: Commercial Managed Care - PPO | Source: Ambulatory Visit | Attending: Urology | Admitting: Urology

## 2020-11-03 HISTORY — DX: Tinnitus, unspecified ear: H93.19

## 2020-11-03 HISTORY — DX: Personal history of urinary calculi: Z87.442

## 2020-11-03 NOTE — Patient Instructions (Signed)
Your procedure is scheduled on:  Thursday, June 2 Report to the Registration Desk on the 1st floor of the [facility] To find out your arrival time, please call [phone] between 1PM - 3PM on: Wednesday, June 1  REMEMBER: Instructions that are not followed completely may result in serious medical risk, up to and including death; or upon the discretion of your surgeon and anesthesiologist your surgery may need to be rescheduled.  Do not eat or drink anything after midnight the night before surgery.  No gum chewing, lozengers or hard candies.  TAKE THESE MEDICATIONS THE MORNING OF SURGERY WITH A SIP OF WATER:  1.  Metoprolol 2.  Rosuvastatin  One week prior to surgery: Stop Anti-inflammatories (NSAIDS) such as Advil, Aleve, Ibuprofen, Motrin, Naproxen, Naprosyn and Aspirin based products such as Excedrin, Goodys Powder, BC Powder. Stop ANY OVER THE COUNTER supplements until after surgery. You may however, continue to take Tylenol if needed for pain up until the day of surgery.  No Alcohol for 24 hours before or after surgery.  No Smoking including e-cigarettes for 24 hours prior to surgery.  No chewable tobacco products for at least 6 hours prior to surgery.  No nicotine patches on the day of surgery.  Do not use any "recreational" drugs for at least a week prior to your surgery.  Please be advised that the combination of cocaine and anesthesia may have negative outcomes, up to and including death. If you test positive for cocaine, your surgery will be cancelled.  On the morning of surgery brush your teeth with toothpaste and water, you may rinse your mouth with mouthwash if you wish. Do not swallow any toothpaste or mouthwash.  Do not wear jewelry, make-up, hairpins, clips or nail polish.  Do not wear lotions, powders, or perfumes.   Do not shave body from the neck down 48 hours prior to surgery just in case you cut yourself which could leave a site for infection.    Contact lenses, hearing aids and dentures may not be worn into surgery.  Do not bring valuables to the hospital. [facility] is not responsible for any missing/lost belongings or valuables.   Bring your C-PAP to the hospital with you in case you may have to spend the night.   Notify your doctor if there is any change in your medical condition (cold, fever, infection).  Wear comfortable clothing (specific to your surgery type) to the hospital.  Plan for stool softeners for home use; pain medications have a tendency to cause constipation. You can also help prevent constipation by eating foods high in fiber such as fruits and vegetables and drinking plenty of fluids as your diet allows.  After surgery, you can help prevent lung complications by doing breathing exercises.  Take deep breaths and cough every 1-2 hours. Your doctor may order a device called an Incentive Spirometer to help you take deep breaths.  If you are being discharged the day of surgery, you will not be allowed to drive home. You will need a responsible adult (18 years or older) to drive you home and stay with you that night.   If you are taking public transportation, you will need to have a responsible adult (18 years or older) with you. Please confirm with your physician that it is acceptable to use public transportation.   Please call the Pre-admissions Testing Dept. at [phone] if you have any questions about these instructions.  Surgery Visitation Policy:  Patients undergoing a surgery  or procedure may have one family member or support person with them as long as that person is not COVID-19 positive or experiencing its symptoms.  That person may remain in the waiting area during the procedure.

## 2020-11-04 ENCOUNTER — Ambulatory Visit: Payer: Commercial Managed Care - PPO | Admitting: Anesthesiology

## 2020-11-04 ENCOUNTER — Ambulatory Visit
Admission: RE | Admit: 2020-11-04 | Discharge: 2020-11-04 | Disposition: A | Discharge status: Home / Self Care | Payer: Commercial Managed Care - PPO | Attending: Urology | Admitting: Urology

## 2020-11-04 ENCOUNTER — Encounter: Payer: Self-pay | Admitting: Urology

## 2020-11-04 ENCOUNTER — Ambulatory Visit: Payer: Commercial Managed Care - PPO

## 2020-11-04 ENCOUNTER — Encounter
Admission: RE | Disposition: A | Discharge status: Home / Self Care | Payer: Self-pay | Source: Home / Self Care | Attending: Urology

## 2020-11-04 DIAGNOSIS — N201 Calculus of ureter: Secondary | ICD-10-CM | POA: Diagnosis not present

## 2020-11-04 DIAGNOSIS — N35912 Unspecified bulbous urethral stricture, male: Secondary | ICD-10-CM | POA: Insufficient documentation

## 2020-11-04 DIAGNOSIS — N200 Calculus of kidney: Secondary | ICD-10-CM

## 2020-11-04 DIAGNOSIS — N202 Calculus of kidney with calculus of ureter: Secondary | ICD-10-CM | POA: Diagnosis not present

## 2020-11-04 HISTORY — PX: CYSTOSCOPY/URETEROSCOPY/HOLMIUM LASER/STENT PLACEMENT: SHX6546

## 2020-11-04 SURGERY — CYSTOSCOPY/URETEROSCOPY/HOLMIUM LASER/STENT PLACEMENT
Anesthesia: General | Laterality: Left

## 2020-11-04 MED ORDER — ONDANSETRON HCL 4 MG/2ML IJ SOLN: 4.0000 mg | Freq: Once | INTRAMUSCULAR | Status: DC | PRN

## 2020-11-04 MED ORDER — PROPOFOL 10 MG/ML IV BOLUS
INTRAVENOUS | Status: AC
  Filled 2020-11-04: qty 40

## 2020-11-04 MED ORDER — CEFAZOLIN SODIUM-DEXTROSE 2-4 GM/100ML-% IV SOLN
INTRAVENOUS | Status: AC
  Filled 2020-11-04: qty 100

## 2020-11-04 MED ORDER — KETOROLAC TROMETHAMINE 30 MG/ML IJ SOLN
INTRAMUSCULAR | Status: DC | PRN
  Administered 2020-11-04: 30 mg via INTRAVENOUS

## 2020-11-04 MED ORDER — MIDAZOLAM HCL 2 MG/2ML IJ SOLN
INTRAMUSCULAR | Status: AC
  Filled 2020-11-04: qty 2

## 2020-11-04 MED ORDER — CHLORHEXIDINE GLUCONATE 0.12 % MT SOLN: 15.0000 mL | Freq: Once | OROMUCOSAL | Status: AC

## 2020-11-04 MED ORDER — MIDAZOLAM HCL 2 MG/2ML IJ SOLN
INTRAMUSCULAR | Status: DC | PRN
  Administered 2020-11-04: 2 mg via INTRAVENOUS

## 2020-11-04 MED ORDER — PROPOFOL 10 MG/ML IV BOLUS
INTRAVENOUS | Status: DC | PRN
  Administered 2020-11-04: 200 mg via INTRAVENOUS

## 2020-11-04 MED ORDER — CHLORHEXIDINE GLUCONATE 0.12 % MT SOLN
OROMUCOSAL | Status: AC
  Administered 2020-11-04: 15 mL via OROMUCOSAL
  Filled 2020-11-04: qty 15

## 2020-11-04 MED ORDER — FENTANYL CITRATE (PF) 100 MCG/2ML IJ SOLN
INTRAMUSCULAR | Status: AC
  Filled 2020-11-04: qty 2

## 2020-11-04 MED ORDER — FENTANYL CITRATE (PF) 100 MCG/2ML IJ SOLN: 25.0000 ug | INTRAMUSCULAR | Status: DC | PRN

## 2020-11-04 MED ORDER — LACTATED RINGERS IV SOLN: INTRAVENOUS | Status: DC | PRN

## 2020-11-04 MED ORDER — LACTATED RINGERS IV SOLN: INTRAVENOUS | Status: DC

## 2020-11-04 MED ORDER — FAMOTIDINE 20 MG PO TABS
ORAL_TABLET | ORAL | Status: AC
  Administered 2020-11-04: 20 mg via ORAL
  Filled 2020-11-04: qty 1

## 2020-11-04 MED ORDER — LIDOCAINE HCL (CARDIAC) PF 100 MG/5ML IV SOSY
PREFILLED_SYRINGE | INTRAVENOUS | Status: DC | PRN
  Administered 2020-11-04: 80 mg via INTRAVENOUS

## 2020-11-04 MED ORDER — FAMOTIDINE 20 MG PO TABS: 20.0000 mg | ORAL_TABLET | Freq: Once | ORAL | Status: AC

## 2020-11-04 MED ORDER — ORAL CARE MOUTH RINSE: 15.0000 mL | Freq: Once | OROMUCOSAL | Status: AC

## 2020-11-04 MED ORDER — CEFAZOLIN SODIUM-DEXTROSE 2-4 GM/100ML-% IV SOLN
2.0000 g | INTRAVENOUS | Status: AC
  Administered 2020-11-04: 2 g via INTRAVENOUS

## 2020-11-04 MED ORDER — DEXAMETHASONE SODIUM PHOSPHATE 10 MG/ML IJ SOLN
INTRAMUSCULAR | Status: DC | PRN
  Administered 2020-11-04: 10 mg via INTRAVENOUS

## 2020-11-04 SURGICAL SUPPLY — 29 items
BAG DRAIN CYSTO-URO LG1000N (MISCELLANEOUS) ×2 IMPLANT
BASKET ZERO TIP 1.9FR (BASKET) ×2 IMPLANT
BRUSH SCRUB EZ 1% IODOPHOR (MISCELLANEOUS) ×2 IMPLANT
BSKT STON RTRVL ZERO TP 1.9FR (BASKET) ×1
CATH URET FLEX-TIP 2 LUMEN 10F (CATHETERS) IMPLANT
CATH URETL 5X70 OPEN END (CATHETERS) ×2 IMPLANT
CNTNR SPEC 2.5X3XGRAD LEK (MISCELLANEOUS)
CONT SPEC 4OZ STER OR WHT (MISCELLANEOUS)
CONT SPEC 4OZ STRL OR WHT (MISCELLANEOUS)
CONTAINER SPEC 2.5X3XGRAD LEK (MISCELLANEOUS) IMPLANT
DRAPE UTILITY 15X26 TOWEL STRL (DRAPES) ×2 IMPLANT
DRSG TEGADERM 2-3/8X2-3/4 SM (GAUZE/BANDAGES/DRESSINGS) ×2 IMPLANT
GLOVE SURG ENC MOIS LTX SZ6.5 (GLOVE) ×2 IMPLANT
GOWN STRL REUS W/ TWL LRG LVL3 (GOWN DISPOSABLE) ×2 IMPLANT
GOWN STRL REUS W/TWL LRG LVL3 (GOWN DISPOSABLE) ×4
GUIDEWIRE GREEN .038 145CM (MISCELLANEOUS) IMPLANT
GUIDEWIRE STR DUAL SENSOR (WIRE) ×2 IMPLANT
INFUSOR MANOMETER BAG 3000ML (MISCELLANEOUS) ×2 IMPLANT
IV NS IRRIG 3000ML ARTHROMATIC (IV SOLUTION) ×2 IMPLANT
KIT TURNOVER CYSTO (KITS) ×2 IMPLANT
MANIFOLD NEPTUNE II (INSTRUMENTS) ×2 IMPLANT
PACK CYSTO AR (MISCELLANEOUS) ×2 IMPLANT
SET CYSTO W/LG BORE CLAMP LF (SET/KITS/TRAYS/PACK) ×2 IMPLANT
SHEATH URETERAL 12FRX35CM (MISCELLANEOUS) IMPLANT
STENT URET 6FRX24 CONTOUR (STENTS) IMPLANT
STENT URET 6FRX26 CONTOUR (STENTS) ×2 IMPLANT
SURGILUBE 2OZ TUBE FLIPTOP (MISCELLANEOUS) ×2 IMPLANT
TRACTIP FLEXIVA PULSE ID 200 (Laser) ×2 IMPLANT
WATER STERILE IRR 1000ML POUR (IV SOLUTION) ×2 IMPLANT

## 2020-11-04 NOTE — Discharge Instructions (Signed)
You have a ureteral stent in place.  This is a tube that extends from your kidney to your bladder.  This may cause urinary bleeding, burning with urination, and urinary frequency.  Please call our office or present to the ED if you develop fevers >101 or pain which is not able to be controlled with oral pain medications.  You may be given either Flomax and/ or ditropan to help with bladder spasms and stent pain in addition to pain medications.    You have a ureteral stent in place.  It is taped to the head of your penis.  On Monday morning, you may untaped and gently pull the string.  This should dislodge the stent.  If you have any issues, please call our office.  If the stent is accidentally prematurely dislodged, let us know during regular business hours.  [facility] [address], [address] [place], [place] [address] [phone]   AMBULATORY SURGERY  DISCHARGE INSTRUCTIONS   1) The drugs that you were given will stay in your system until tomorrow so for the next 24 hours you should not:  A) Drive an automobile B) Make any legal decisions C) Drink any alcoholic beverage   2) You may resume regular meals tomorrow.  Today it is better to start with liquids and gradually work up to solid foods.  You may eat anything you prefer, but it is better to start with liquids, then soup and crackers, and gradually work up to solid foods.   3) Please notify your doctor immediately if you have any unusual bleeding, trouble breathing, redness and pain at the surgery site, drainage, fever, or pain not relieved by medication.    4) Additional Instructions:    Please contact your physician with any problems or Same Day Surgery at [phone], Monday through Friday 6 am to 4 pm, or [place] at [facility] number at [phone].

## 2020-11-04 NOTE — Interval H&P Note (Signed)
History and Physical Interval Note:  11/04/2020 1:03 PM  Kirk Mueller  has presented today for surgery, with the diagnosis of Kidney Stones.  The various methods of treatment have been discussed with the patient and family. After consideration of risks, benefits and other options for treatment, the patient has consented to  Procedure(s): CYSTOSCOPY/URETEROSCOPY/HOLMIUM LASER/STENT EXCHANGE (Left) as a surgical intervention.  The patient's history has been reviewed, patient examined, no change in status, stable for surgery.  I have reviewed the patient's chart and labs.  Questions were answered to the patient's satisfaction.    RRR CTAB   Vanna Scotland

## 2020-11-04 NOTE — Transfer of Care (Signed)
Immediate Anesthesia Transfer of Care Note  Patient: CLAYVON PARLETT  Procedure(s) Performed: CYSTOSCOPY/URETEROSCOPY/HOLMIUM LASER/STENT EXCHANGE (Left )  Patient Location: PACU  Anesthesia Type:General  Level of Consciousness: awake, alert  and oriented  Airway & Oxygen Therapy: Patient Spontanous Breathing and Patient connected to face mask oxygen  Post-op Assessment: Report given to RN and Post -op Vital signs reviewed and stable  Post vital signs: Reviewed and stable  Last Vitals:  Vitals Value Taken Time  BP    Temp    Pulse 70 11/04/20 1415  Resp 17 11/04/20 1415  SpO2 94 % 11/04/20 1415  Vitals shown include unvalidated device data.  Last Pain:  Vitals:   11/04/20 1105  TempSrc: Oral  PainSc: 0-No pain         Complications: No complications documented.

## 2020-11-04 NOTE — Op Note (Signed)
Date of procedure: 11/04/20  Preoperative diagnosis:  1. Left ureteral stones 2. Left flank pain  Postoperative diagnosis:  1. Same as above 2. Left kidney stone 3. Bulbar urethral stricture  Procedure: 1. Left ureteroscopy with laser lithotripsy 2. Left basket extraction of stone fragment 3. Left retrograde pyelogram 4. Left ureteral stent exchange 5. Interpretation of fluoroscopy less than 30 minutes  Surgeon: Vanna Scotland, MD  Anesthesia: General  Complications: None  Intraoperative findings: 2 ureteral stones identified, 2 mm fragment at the UVJ and a 4 mm fragment in the mid ureter.  An additional nonobstructing 2 mm fragment identified in the left lower pole.  Stent left on tether.  Uncomplicated procedure.  EBL: Minimal  Specimens: Stone fragment  Drains: 6 x 26 double-J ureteral stent on the left with tether  Indication: Kirk Mueller is a 60 y.o. patient with a millimeter left nonobstructing stone status post ESWL complicated by severe pain from stone fragments ultimately necessitating stent.  He returns today for definitive stone management.  After reviewing the management options for treatment, he elected to proceed with the above surgical procedure(s). We have discussed the potential benefits and risks of the procedure, side effects of the proposed treatment, the likelihood of the patient achieving the goals of the procedure, and any potential problems that might occur during the procedure or recuperation. Informed consent has been obtained.  Description of procedure:  The patient was taken to the operating room and general anesthesia was induced.  The patient was placed in the dorsal lithotomy position, prepped and draped in the usual sterile fashion, and preoperative antibiotics were administered. A preoperative time-out was performed.   A 21 French the scope was advanced per urethra into the bladder.  Notably, there is a very mild bulbar urethral stricture which  was able to be traversed using a wire and then advancing the scope over the wire into the bladder.  Attention was turned to the left ureteral orifice.  Ureteral stent was seen emanating from the UO.  He is stent graspers to grasp the distal coil the stent and bring it to the level of the urethral meatus.  It then cannulated this using a sensor wire up to the level of the kidney.  I remove the stent and left the wire in place as a safety wire which was snapped to the drape.  A semirigid ureteroscope was then brought in and advanced just within the UO.  Immediately, small 2 mm fragment was identified.  A 1.9 French tipless nitinol basket was used to extract the small stone.  I then advanced the scope up to level of the mid ureter where another stone was encountered but this appeared too large to basket as the caliber of the ureter at this level was fairly narrow.  I brought in a 242 m laser fiber and using the settings of 0.3 J and 60 Hz, I fragmented the stone into 3 smaller pieces.  Each of the fragments were then removed.  I then advanced the scope all the way up to the proximal ureter no additional stones or stone fragments were identified.  Super Stiff wire was then advanced to the level of the kidney.  I remove the semirigid and advanced a digital flexible ureteroscope all the way up to the level of the renal pelvis which went easily under fluoroscopic guidance.  I then surveyed the entire kidney.  A small 2 mm ring was identified in the left lower pole which was also basketed.  There were no additional fragments identified.  Upon removing the scope I inspected the ureter.  There was minimal ureteral trauma and no additional stone fragments.  Finally, a 6 x 26 French ureteral stent was advanced over the wire up to the level of the kidney.  The wire was removed and a full coil was noted both within the renal pelvis as well as within the bladder.  The stent string was affixed to the patient's glans using Mastisol  and Tegaderm.  He was cleaned and dried, repositioned in supine position, reversed myesthesia, and taken the PACU in stable condition.  Plan: He will remove his own stent on Monday.  I will see him in 4 weeks with renal ultrasound prior.  No additional prescriptions were given today as he has plenty of pain meds at home.  Vanna Scotland, M.D.

## 2020-11-04 NOTE — Anesthesia Preprocedure Evaluation (Signed)
Anesthesia Evaluation  Patient identified by MRN, date of birth, ID band Patient awake    Reviewed: Allergy & Precautions, H&P , NPO status , Patient's Chart, lab work & pertinent test results, reviewed documented beta blocker date and time   History of Anesthesia Complications (+) history of anesthetic complications  Airway Mallampati: II  TM Distance: >3 FB Neck ROM: full    Dental  (+) Teeth Intact   Pulmonary neg pulmonary ROS,    Pulmonary exam normal        Cardiovascular Exercise Tolerance: Good hypertension, On Medications Normal cardiovascular exam+ Valvular Problems/Murmurs  Rhythm:regular Rate:Normal     Neuro/Psych  Headaches, negative psych ROS   GI/Hepatic Neg liver ROS, GERD  Medicated,  Endo/Other  negative endocrine ROS  Renal/GU negative Renal ROS  negative genitourinary   Musculoskeletal   Abdominal   Peds  Hematology negative hematology ROS (+)   Anesthesia Other Findings Past Medical History: No date: Arthritis No date: Complication of anesthesia     Comment:  headache, very active when came out of anesthesia;               feeling of suffocation when tube removed No date: GERD (gastroesophageal reflux disease) No date: Headache No date: Heart murmur     Comment:  as a teenager  No date: History of kidney stones No date: Hypercholesteremia No date: Hypertension No date: Tinnitus Past Surgical History: No date: brown recluse bite No date: COLONOSCOPY 10/24/2020: CYSTOSCOPY W/ URETERAL STENT PLACEMENT; Left     Comment:  Procedure: CYSTOSCOPY WITH RETROGRADE PYELOGRAM/URETERAL              STENT PLACEMENT;  Surgeon: Noel Christmas, MD;                Location: [facility] OR;  Service: Urology;  Laterality: Left; 10/21/2020: EXTRACORPOREAL SHOCK WAVE LITHOTRIPSY; Left     Comment:  Procedure: EXTRACORPOREAL SHOCK WAVE LITHOTRIPSY (ESWL);              Surgeon: Vanna Scotland, MD;  Location:  [facility] ORS;                Service: Urology;  Laterality: Left; 2003, 2005, 2007: KNEE ARTHROSCOPY; Right 05/17/2016: LUMBAR LAMINECTOMY/DECOMPRESSION MICRODISCECTOMY; N/A     Comment:  Procedure: MICRO LUMBER DECOMPRESSION L4-5, L5-S1;                Surgeon: Jene Every, MD;  Location: [facility] ORS;  Service:               Orthopedics;  Laterality: N/A; 1992: TIBIA FRACTURE SURGERY; Left     Comment:  fracture tibia, fibula 2019: TOE SURGERY; Right     Comment:  pins placed BMI    Body Mass Index: 28.50 kg/m     Reproductive/Obstetrics negative OB ROS                             Anesthesia Physical Anesthesia Plan  ASA: II  Anesthesia Plan: General ETT   Post-op Pain Management:    Induction:   PONV Risk Score and Plan:   Airway Management Planned:   Additional Equipment:   Intra-op Plan:   Post-operative Plan:   Informed Consent: I have reviewed the patients History and Physical, chart, labs and discussed the procedure including the risks, benefits and alternatives for the proposed anesthesia with the patient or authorized representative who has indicated his/her understanding and acceptance.  Dental Advisory Given  Plan Discussed with: CRNA  Anesthesia Plan Comments:         Anesthesia Quick Evaluation

## 2020-11-05 ENCOUNTER — Encounter: Payer: Self-pay | Admitting: Urology

## 2020-11-08 ENCOUNTER — Encounter: Payer: Self-pay | Admitting: Urology

## 2020-11-08 ENCOUNTER — Other Ambulatory Visit: Payer: Self-pay | Admitting: *Deleted

## 2020-11-08 DIAGNOSIS — N200 Calculus of kidney: Secondary | ICD-10-CM

## 2020-11-09 LAB — CALCULI, WITH PHOTOGRAPH (CLINICAL LAB)
Calcium Oxalate Dihydrate: 10 %
Calcium Oxalate Monohydrate: 90 %
Weight Calculi: 3 mg

## 2020-11-10 NOTE — Anesthesia Postprocedure Evaluation (Signed)
Anesthesia Post Note  Patient: Kirk Mueller  Procedure(s) Performed: CYSTOSCOPY/URETEROSCOPY/HOLMIUM LASER/STENT EXCHANGE (Left )  Patient location during evaluation: PACU Anesthesia Type: General Level of consciousness: awake and alert Pain management: pain level controlled Vital Signs Assessment: post-procedure vital signs reviewed and stable Respiratory status: spontaneous breathing, nonlabored ventilation, respiratory function stable and patient connected to nasal cannula oxygen Cardiovascular status: blood pressure returned to baseline and stable Postop Assessment: no apparent nausea or vomiting Anesthetic complications: no   No complications documented.   Last Vitals:  Vitals:   11/04/20 1511 11/04/20 1533  BP: (!) 153/95 (!) 141/79  Pulse: 61 62  Resp: 16 16  Temp: 37.5 C   SpO2: 100% 99%    Last Pain:  Vitals:   11/05/20 0809  TempSrc:   PainSc: 2                  Yevette Edwards

## 2020-11-17 ENCOUNTER — Ambulatory Visit (HOSPITAL_COMMUNITY)
Admission: RE | Admit: 2020-11-17 | Discharge: 2020-11-17 | Disposition: A | Discharge status: Home / Self Care | Payer: Commercial Managed Care - PPO | Source: Ambulatory Visit | Attending: Urology | Admitting: Urology

## 2020-11-17 DIAGNOSIS — N200 Calculus of kidney: Secondary | ICD-10-CM | POA: Diagnosis present

## 2020-11-18 ENCOUNTER — Encounter: Payer: Self-pay | Admitting: Urology

## 2020-11-23 ENCOUNTER — Encounter: Payer: Self-pay | Admitting: Urology

## 2020-12-14 ENCOUNTER — Encounter: Payer: Self-pay | Admitting: Urology

## 2020-12-16 ENCOUNTER — Other Ambulatory Visit: Payer: Self-pay

## 2020-12-16 ENCOUNTER — Encounter: Payer: Self-pay | Admitting: Urology

## 2020-12-16 ENCOUNTER — Ambulatory Visit (INDEPENDENT_AMBULATORY_CARE_PROVIDER_SITE_OTHER): Payer: Commercial Managed Care - PPO | Admitting: Urology

## 2020-12-16 VITALS — BP 117/77 | HR 54 | Ht 69.0 in | Wt 189.0 lb

## 2020-12-16 DIAGNOSIS — N4340 Spermatocele of epididymis, unspecified: Secondary | ICD-10-CM

## 2020-12-16 DIAGNOSIS — N281 Cyst of kidney, acquired: Secondary | ICD-10-CM | POA: Diagnosis not present

## 2020-12-16 DIAGNOSIS — Z87898 Personal history of other specified conditions: Secondary | ICD-10-CM | POA: Diagnosis not present

## 2020-12-16 DIAGNOSIS — N200 Calculus of kidney: Secondary | ICD-10-CM | POA: Diagnosis not present

## 2020-12-16 NOTE — Progress Notes (Signed)
12/16/2020 9:05 AM   Kirk Mueller 1960/10/05 [id number]  Referring provider: Ignatius Specking, MD [address] [place],  [place] [address]  Chief Complaint  Patient presents with   Nephrolithiasis    HPI: 60 year old male with recent episode of spontaneous passage of a right-sided stone followed by ESWL complicated by flank pain/retained stone necessitating stent and ultimately definitive management in the form of ureteroscopy for the stone.  He returns today for follow-up of this.  Is able to remove his own stent without difficulty.    His postoperative course was complicated by epididymitis for which she was treated by his primary care physician.  He reports that this was not particularly tender when he noticed a significant large meant in his right epididymis which was concerning.  He was diagnosed with epididymal cyst in the remote past but felt that there is a frank change.  Its remained stable.  He is now having any pain or discomfort in this area other than when he squeezes it.  He follows up today with renal ultrasound which is unremarkable, no evidence of stone disease or hydronephrosis bilaterally.  Stone composition consistent with 90% calcium oxalate monohydrate, 10% calcium oxalate dihydrate.  He mentions today that he no longer has a urologist in [place] as he retired.  He was being followed for history of acutely elevated PSA for which he underwent a biopsy several years back when his PSA rose to 6.  Since trended back down to normal, most recent 0.6.  He also mentions that when he was taking Flomax during this stone event, he did notice some improvement in his urinary symptoms.  He sent stopping the Flomax and his symptoms have now completely resolved.  He feels like he is emptying well without urgency or frequency.    PMH: Past Medical History:  Diagnosis Date   Arthritis    Complication of anesthesia    headache, very active when came out of anesthesia; feeling of  suffocation when tube removed   GERD (gastroesophageal reflux disease)    Headache    Heart murmur    as a teenager    History of kidney stones    Hypercholesteremia    Hypertension    Tinnitus     Surgical History: Past Surgical History:  Procedure Laterality Date   brown recluse bite     COLONOSCOPY     CYSTOSCOPY W/ URETERAL STENT PLACEMENT Left 10/24/2020   Procedure: CYSTOSCOPY WITH RETROGRADE PYELOGRAM/URETERAL STENT PLACEMENT;  Surgeon: Noel Christmas, MD;  Location: [facility] OR;  Service: Urology;  Laterality: Left;   CYSTOSCOPY/URETEROSCOPY/HOLMIUM LASER/STENT PLACEMENT Left 11/04/2020   Procedure: CYSTOSCOPY/URETEROSCOPY/HOLMIUM LASER/STENT EXCHANGE;  Surgeon: Vanna Scotland, MD;  Location: [facility] ORS;  Service: Urology;  Laterality: Left;   EXTRACORPOREAL SHOCK WAVE LITHOTRIPSY Left 10/21/2020   Procedure: EXTRACORPOREAL SHOCK WAVE LITHOTRIPSY (ESWL);  Surgeon: Vanna Scotland, MD;  Location: [facility] ORS;  Service: Urology;  Laterality: Left;   KNEE ARTHROSCOPY Right 2003, 2005, 2007   LUMBAR LAMINECTOMY/DECOMPRESSION MICRODISCECTOMY N/A 05/17/2016   Procedure: MICRO LUMBER DECOMPRESSION L4-5, L5-S1;  Surgeon: Jene Every, MD;  Location: [facility] ORS;  Service: Orthopedics;  Laterality: N/A;   TIBIA FRACTURE SURGERY Left 1992   fracture tibia, fibula   TOE SURGERY Right 2019   pins placed    Home Medications:  Allergies as of 12/16/2020       Reactions   Tuberculin Purified Protein Derivative Other (See Comments)   No tb testing due to exposures  Medication List        Accurate as of December 16, 2020  9:05 AM. If you have any questions, ask your nurse or doctor.          STOP taking these medications    acetaminophen 500 MG tablet Commonly known as: TYLENOL Stopped by: Vanna Scotland, MD   loratadine 10 MG tablet Commonly known as: CLARITIN Stopped by: Vanna Scotland, MD   ondansetron 4 MG disintegrating tablet Commonly known as: ZOFRAN-ODT Stopped by:  Vanna Scotland, MD   tamsulosin 0.4 MG Caps capsule Commonly known as: Flomax Stopped by: Vanna Scotland, MD       TAKE these medications    celecoxib 200 MG capsule Commonly known as: CELEBREX Take 200 mg by mouth See admin instructions. Take 1 capsule (200 mg) by mouth the day before, the day of & the day after strenuous activity   metoprolol tartrate 25 MG tablet Commonly known as: LOPRESSOR Take 12.5 mg by mouth 2 (two) times daily.   rosuvastatin 10 MG tablet Commonly known as: CRESTOR Take 10 mg by mouth in the morning.        Allergies:  Allergies  Allergen Reactions   Tuberculin Purified Protein Derivative Other (See Comments)    No tb testing due to exposures    Family History: History reviewed. No pertinent family history.  Social History:  reports that he has never smoked. He has never used smokeless tobacco. He reports that he does not drink alcohol and does not use drugs.   Physical Exam: BP 117/77   Pulse (!) 54   Ht 5\' 9"  (1.753 m)   Wt 189 lb (85.7 kg)   BMI 27.91 kg/m   Constitutional:  Alert and oriented, No acute distress. HEENT: [place] AT, moist mucus membranes.  Trachea midline, no masses. Cardiovascular: No clubbing, cyanosis, or edema. Respiratory: Normal respiratory effort, no increased work of breathing. GU: Circumcised phallus with orthotopic meatus.  Bilateral descended testicles which are unremarkable without masses.  There is some fullness of his right epididymis, approximately 1/2 cm consistent with probable epididymal cyst versus spermatocele.  No tenderness.  Skin: No rashes, bruises or suspicious lesions. Neurologic: Grossly intact, no focal deficits, moving all 4 extremities. Psychiatric: Normal mood and affect.   Pertinent Imaging: Ultrasound renal complete  Narrative CLINICAL DATA:  Nephrolithiasis  EXAM: RENAL / URINARY TRACT ULTRASOUND COMPLETE  COMPARISON:  CT abdomen and pelvis Oct 24, 2020  FINDINGS: Right  Kidney:  Renal measurements: 12.3 x 5.4 x 5.9 cm = volume: 204.0 mL. Echogenicity and renal cortical thickness are within normal limits. No mass, perinephric fluid, or hydronephrosis visualized. No sonographically demonstrable calculus or ureterectasis.  Left Kidney:  Renal measurements: 12.4 x 5.2 x 6.1 cm = volume: 204.2 mL. Echogenicity and renal cortical thickness are within normal limits. No perinephric fluid or hydronephrosis visualized. There is a cyst arising from the lower pole left kidney measuring 2.3 x 2.1 x 2.1 cm. No sonographically demonstrable calculus or ureterectasis.  Bladder:  Appears normal for degree of bladder distention.  Other:  None.  IMPRESSION: Cyst arising from lower pole left kidney measuring 2.3 x 2.1 x 2.1 cm. Study otherwise unremarkable.   Electronically Signed By: Oct 26, 2020 III M.D. On: 11/17/2020 14:51   Renal ultrasound was personally reviewed.  Agree with radiologic interpretation.  Assessment & Plan:    1. Kidney stones Status post ESWL, stent followed by ureteroscopy for left-sided stone  Currently stone free without hydronephrosis  Reviewed stone  analysis  We discussed general stone prevention techniques including drinking plenty water with goal of producing 2.5 L urine daily, increased citric acid intake, avoidance of high oxalate containing foods, and decreased salt intake.  Information about dietary recommendations given today.   We discussed the role of 24-hour urine, he like to hold off unless he has another stone event then we will pursue this.  He is dramatically changed his diet and lost a good amount of weight in doing so.  He is pleased with this.  Will plan for KUB in 1 year to reassess.   2. Renal cyst Lesion in question represents a simple cyst, no further characterization or surveillance needed   3. History of elevated PSA Mentions a personal history of elevated PSA status post negative biopsy in  the remote past by retired urologist  Most recent PSA 0.6 this at this point time, no indication for further intervention  I recommended that he continue to have his annual PSA drawn by his PCP and if his PSA begins to rise, we can reassess at that time.  He was also briefly having some urinary symptoms but ultimately these have subsided, likely exacerbated by stone episode and urologic intervention.  No longer on Flomax.  Will reassess if his symptoms worsen.  4. Spermatocele Spermatocele versus epididymal cyst, benign on exam and minimally symptomatic  I would not recommend any further intervention unless it continues to grow or develops symptoms  No need for imaging as exam is pathopneumonic for this    F/u 1 year with KUB (virtual ok)  Vanna Scotland, MD  [facility] [address], [address] [place], [place] [address] [phone]  I spent 35 total minutes on the day of the encounter including pre-visit review of the medical record, face-to-face time with the patient, and post visit ordering of labs/imaging/tests.

## 2020-12-28 ENCOUNTER — Encounter: Payer: Self-pay | Admitting: Urology

## 2021-10-24 ENCOUNTER — Encounter: Payer: Self-pay | Admitting: Urology

## 2021-11-05 ENCOUNTER — Encounter (HOSPITAL_COMMUNITY): Payer: Self-pay | Admitting: Emergency Medicine

## 2021-11-05 ENCOUNTER — Emergency Department (HOSPITAL_COMMUNITY)
Admission: EM | Admit: 2021-11-05 | Discharge: 2021-11-05 | Disposition: A | Discharge status: Home / Self Care | Payer: No Typology Code available for payment source | Attending: Emergency Medicine | Admitting: Emergency Medicine

## 2021-11-05 ENCOUNTER — Other Ambulatory Visit: Payer: Self-pay

## 2021-11-05 ENCOUNTER — Emergency Department (HOSPITAL_COMMUNITY): Payer: No Typology Code available for payment source

## 2021-11-05 DIAGNOSIS — M549 Dorsalgia, unspecified: Secondary | ICD-10-CM | POA: Insufficient documentation

## 2021-11-05 DIAGNOSIS — R109 Unspecified abdominal pain: Secondary | ICD-10-CM | POA: Diagnosis present

## 2021-11-05 LAB — CBC WITH DIFFERENTIAL/PLATELET
Abs Immature Granulocytes: 0.02 10*3/uL (ref 0.00–0.07)
Basophils Absolute: 0 10*3/uL (ref 0.0–0.1)
Basophils Relative: 0 %
Eosinophils Absolute: 0.2 10*3/uL (ref 0.0–0.5)
Eosinophils Relative: 4 %
HCT: 48.5 % (ref 39.0–52.0)
Hemoglobin: 17 g/dL (ref 13.0–17.0)
Immature Granulocytes: 0 %
Lymphocytes Relative: 23 %
Lymphs Abs: 1.5 10*3/uL (ref 0.7–4.0)
MCH: 31.3 pg (ref 26.0–34.0)
MCHC: 35.1 g/dL (ref 30.0–36.0)
MCV: 89.3 fL (ref 80.0–100.0)
Monocytes Absolute: 0.7 10*3/uL (ref 0.1–1.0)
Monocytes Relative: 11 %
Neutro Abs: 4.2 10*3/uL (ref 1.7–7.7)
Neutrophils Relative %: 62 %
Platelets: 198 10*3/uL (ref 150–400)
RBC: 5.43 MIL/uL (ref 4.22–5.81)
RDW: 12.4 % (ref 11.5–15.5)
WBC: 6.8 10*3/uL (ref 4.0–10.5)
nRBC: 0 % (ref 0.0–0.2)

## 2021-11-05 LAB — COMPREHENSIVE METABOLIC PANEL
ALT: 52 U/L — ABNORMAL HIGH (ref 0–44)
AST: 32 U/L (ref 15–41)
Albumin: 4.2 g/dL (ref 3.5–5.0)
Alkaline Phosphatase: 63 U/L (ref 38–126)
Anion gap: 7 (ref 5–15)
BUN: 16 mg/dL (ref 8–23)
CO2: 25 mmol/L (ref 22–32)
Calcium: 9.3 mg/dL (ref 8.9–10.3)
Chloride: 106 mmol/L (ref 98–111)
Creatinine, Ser: 0.99 mg/dL (ref 0.61–1.24)
GFR, Estimated: >60 mL/min (ref >60)
Glucose, Bld: 106 mg/dL — ABNORMAL HIGH (ref 70–99)
Potassium: 4.5 mmol/L (ref 3.5–5.1)
Sodium: 138 mmol/L (ref 135–145)
Total Bilirubin: 0.9 mg/dL (ref 0.3–1.2)
Total Protein: 6.5 g/dL (ref 6.5–8.1)

## 2021-11-05 LAB — LIPASE, BLOOD: Lipase: 138 U/L — ABNORMAL HIGH (ref 11–51)

## 2021-11-05 MED ORDER — SODIUM CHLORIDE 0.9 % IV BOLUS
500.0000 mL | Freq: Once | INTRAVENOUS | Status: AC
  Administered 2021-11-05: 500 mL via INTRAVENOUS

## 2021-11-05 MED ORDER — CYCLOBENZAPRINE HCL 10 MG PO TABS: 10.0000 mg | ORAL_TABLET | Freq: Two times a day (BID) | ORAL | 0 refills | Status: DC | PRN

## 2021-11-05 MED ORDER — MORPHINE SULFATE (PF) 2 MG/ML IV SOLN
2.0000 mg | Freq: Once | INTRAVENOUS | Status: AC
  Administered 2021-11-05: 2 mg via INTRAVENOUS
  Filled 2021-11-05: qty 1

## 2021-11-05 MED ORDER — KETOROLAC TROMETHAMINE 15 MG/ML IJ SOLN
15.0000 mg | Freq: Once | INTRAMUSCULAR | Status: AC
  Administered 2021-11-05: 15 mg via INTRAVENOUS
  Filled 2021-11-05: qty 1

## 2021-11-05 NOTE — Discharge Instructions (Addendum)
Return for any problem.  Take ibuprofen as instructed for pain.  Use prescribed Flexeril for muscle spasm.  Follow-up with your regular care providers on Monday as instructed.

## 2021-11-05 NOTE — ED Triage Notes (Signed)
Pt arrives c/o sharp flank/back pain to R side starting last weekend that subsided during the week and started again last PM. Pt endorses 8/10 pain. Hx of kidney stone. Pt states this feels the same as his last kidney stone. Denies other urinary sx.

## 2021-11-05 NOTE — ED Provider Notes (Signed)
[facility] EMERGENCY DEPARTMENT Provider Note   CSN: [id number] Arrival date & time: 11/05/21  0857     History  Chief Complaint  Patient presents with   Flank Pain    Kirk Mueller is a 61 y.o. male.  61 year old male with prior medical history as detailed below presents for evaluation.  Patient complains of pain to the posterior right flank.  Pain has been ongoing for the last week.  Patient reports worsening of pain with movement.  Patient reports increased pain this morning which is why he came to the ED.  He reports that he has not taken anything at home for his pain including over-the-counter medications such as Tylenol or Motrin.  Patient reports prior history of renal colic.  He did require intervention by urology for prior renal stones.  Patient reports that today's pain is very similar to prior episodes of renal colic.  He denies nausea, vomiting, fever, bowel movement change, urinary symptoms.  The history is provided by the patient and medical records.  Flank Pain This is a new problem. The current episode started more than 1 week ago. The problem occurs rarely. The problem has been gradually worsening. The symptoms are aggravated by standing and twisting. Nothing relieves the symptoms.      Home Medications Prior to Admission medications   Medication Sig Start Date End Date Taking? Authorizing Provider  celecoxib (CELEBREX) 200 MG capsule Take 200 mg by mouth See admin instructions. Take 1 capsule (200 mg) by mouth the day before, the day of & the day after strenuous activity    [provider]  metoprolol tartrate (LOPRESSOR) 25 MG tablet Take 12.5 mg by mouth 2 (two) times daily.    [provider]  rosuvastatin (CRESTOR) 10 MG tablet Take 10 mg by mouth in the morning.    [provider]      Allergies    Tuberculin purified protein derivative    Review of Systems   Review of Systems  Genitourinary:  Positive for  flank pain.  All other systems reviewed and are negative.  Physical Exam Updated Vital Signs BP 117/89   Pulse (!) 51   Temp 97.7 F (36.5 C) (Oral)   Resp 16   Ht 5\' 9"  (1.753 m)   Wt 91.6 kg   SpO2 98%   BMI 29.83 kg/m  Physical Exam Vitals and nursing note reviewed.  Constitutional:      General: He is not in acute distress.    Appearance: Normal appearance. He is well-developed.  HENT:     Head: Normocephalic and atraumatic.  Eyes:     Conjunctiva/sclera: Conjunctivae normal.     Pupils: Pupils are equal, round, and reactive to light.  Cardiovascular:     Rate and Rhythm: Normal rate and regular rhythm.     Heart sounds: Normal heart sounds.  Pulmonary:     Effort: Pulmonary effort is normal. No respiratory distress.     Breath sounds: Normal breath sounds.  Abdominal:     General: There is no distension.     Palpations: Abdomen is soft.     Tenderness: There is no abdominal tenderness.  Musculoskeletal:        General: Tenderness present. No deformity. Normal range of motion.     Cervical back: Normal range of motion and neck supple.     Comments: Mild tenderness to palpation overlying the right posterior flank.  Skin:    General: Skin is warm and  dry.  Neurological:     General: No focal deficit present.     Mental Status: He is alert and oriented to person, place, and time.    ED Results / Procedures / Treatments   Labs (all labs ordered are listed, but only abnormal results are displayed) Labs Reviewed  COMPREHENSIVE METABOLIC PANEL - Abnormal; Notable for the following components:      Result Value   Glucose, Bld 106 (*)    ALT 52 (*)    All other components within normal limits  LIPASE, BLOOD - Abnormal; Notable for the following components:   Lipase 138 (*)    All other components within normal limits  CBC WITH DIFFERENTIAL/PLATELET  URINALYSIS, ROUTINE W REFLEX MICROSCOPIC    EKG None  Radiology No results found.  Procedures Procedures     Medications Ordered in ED Medications  sodium chloride 0.9 % bolus 500 mL (500 mLs Intravenous New Bag/Given 11/05/21 0929)  ketorolac (TORADOL) 15 MG/ML injection 15 mg (15 mg Intravenous Given 11/05/21 0930)  morphine (PF) 2 MG/ML injection 2 mg (2 mg Intravenous Given 11/05/21 0931)    ED Course/ Medical Decision Making/ A&P                           Medical Decision Making Amount and/or Complexity of Data Reviewed Labs: ordered. Radiology: ordered.  Risk Prescription drug management.    Medical Screen Complete  This patient presented to the ED with complaint of right flank and back pain.  This complaint involves an extensive number of treatment options. The initial differential diagnosis includes, but is not limited to, renal colic, musculoskeletal strain, intra abdominal pathology, etc.  This presentation is: Acute, Self-Limited, Previously Undiagnosed, Uncertain Prognosis, Complicated, Systemic Symptoms, and Threat to Life/Bodily Function  Patient is presenting with complaint of right-sided flank and back pain.  Patient with prior history of renal colic.  He describes the pain today is similar to prior episodes of renal colic.  Exam is suggestive of likely musculoskeletal origin.  Patient's pain is reproduced with palpitation of the lateral right paraspinal musculature.  Patient's pain is worse with movement such as leaning forward during the exam.  Work-up is without evidence of significant acute pathology otherwise.  Exam and imaging is not consistent with possible pancreatitis.  Patient denies any history of pancreatitis.  Patient does not drink alcohol.  Patient does feel improved after administration of Toradol and morphine here in the ED.  He does understand need for close outpatient follow-up.  Strict return precautions given and understood.  Co morbidities that complicated the patient's evaluation  History of renal colic   Additional history  obtained:  External records from outside sources obtained and reviewed including prior ED visits and prior Inpatient records.    Lab Tests:  I ordered and personally interpreted labs.  The pertinent results include: CBC, CMP, lipase   Imaging Studies ordered:  I ordered imaging studies including CT abdomen pelvis I independently visualized and interpreted obtained imaging which showed no acute pathology I agree with the radiologist interpretation.   Cardiac Monitoring:  The patient was maintained on a cardiac monitor.  I personally viewed and interpreted the cardiac monitor which showed an underlying rhythm of: NSR   Medicines ordered:  I ordered medication including Toradol, morphine for pain Reevaluation of the patient after these medicines showed that the patient: resolved   Problem List / ED Course:  Back pain   Reevaluation:  After the interventions noted above, I reevaluated the patient and found that they have: improved   Disposition:  After consideration of the diagnostic results and the patients response to treatment, I feel that the patent would benefit from close outpatient follow-up.          Final Clinical Impression(s) / ED Diagnoses Final diagnoses:  Other acute back pain    Rx / DC Orders ED Discharge Orders     None         Wynetta Fines, MD 11/05/21 1116

## 2021-11-05 NOTE — ED Notes (Signed)
Patient transported to CT 

## 2021-11-05 NOTE — ED Notes (Signed)
Pt verbalizes understanding of discharge instructions. Opportunity for questions and answers were provided. Pt discharged from the ED.   ?

## 2021-12-13 ENCOUNTER — Telehealth (INDEPENDENT_AMBULATORY_CARE_PROVIDER_SITE_OTHER): Payer: Commercial Managed Care - PPO | Admitting: Urology

## 2021-12-13 DIAGNOSIS — N200 Calculus of kidney: Secondary | ICD-10-CM | POA: Diagnosis not present

## 2021-12-13 DIAGNOSIS — N281 Cyst of kidney, acquired: Secondary | ICD-10-CM

## 2021-12-13 NOTE — Progress Notes (Signed)
Virtual Visit via Video Note  I connected with Kirk Mueller on 12/13/2021 at  4:15 PM EDT by a video enabled telemedicine application and verified that I am speaking with the correct person using two identifiers.  Location: Patient: Home Provider: [employer]    I discussed the limitations of evaluation and management by telemedicine and the availability of in person appointments. The patient expressed understanding and agreed to proceed.  History of Present Illness: Kirk Mueller is a 61 y.o. male with a personal history of spontaneous passage of a right-sided stone followed by ESWL complicated by flank pain/retained stone necessitating stent and ultimately definitive management in the form of ureteroscopy for the stone who returns today for a 1 year virtual visit.   Stone composition consistent with 90% calcium oxalate monohydrate, 10% calcium oxalate dihydrate.  His postoperative course was complicated by epididymitis for which she was treated by his primary care physician.  He underwent a CT abdomen and pelvis on 11/05/2021 during ER visit to further evaluate acute back pain. It was personally reviewed today and visualized.  He is punctate bilateral stones but no significant stone burden overall.    He had some questions about his renal cyst today   Observations/Objective: Appears well  Assessment and Plan: Kidney stones - Most recent event more likely musculoskeletal in nature CT scan was personally reviewed today. Do not believe any further work-up is warranted  -Overall stone burden is minuscule, would not recommend continued surveillance or further intervention at this point in time, return as needed if he develops flank pain  2.Renal cyst  - stable and does not warrant any further work-up (consistent with Bosniak 1)  Follow Up Instructions: prn   I discussed the assessment and treatment plan with the patient. The patient was provided an opportunity to ask questions  and all were answered. The patient agreed with the plan and demonstrated an understanding of the instructions.   The patient was advised to call back or seek an in-person evaluation if the symptoms worsen or if the condition fails to improve as anticipated.  I provided 11 minutes of non-face-to-face time during this encounter.   I,Kailey Littlejohn,acting as a Neurosurgeon for Vanna Scotland, MD.,have documented all relevant documentation on the behalf of Vanna Scotland, MD,as directed by  Vanna Scotland, MD while in the presence of Vanna Scotland, MD.  I have reviewed the above documentation for accuracy and completeness, and I agree with the above.   Vanna Scotland, MD

## 2023-01-16 IMAGING — CR DG ABDOMEN 1V
1 series · 2 of 2 positions shown · non-contrast
Comparison: CT 10/15/2020.  Abdomen 10/01/2020.

CLINICAL DATA: Lithotripsy.

EXAM:
ABDOMEN - 1 VIEW

[Series 1: dg abd 1 view · 0.14mm/px · 2 of 2 slices shown]
[im 1/2]
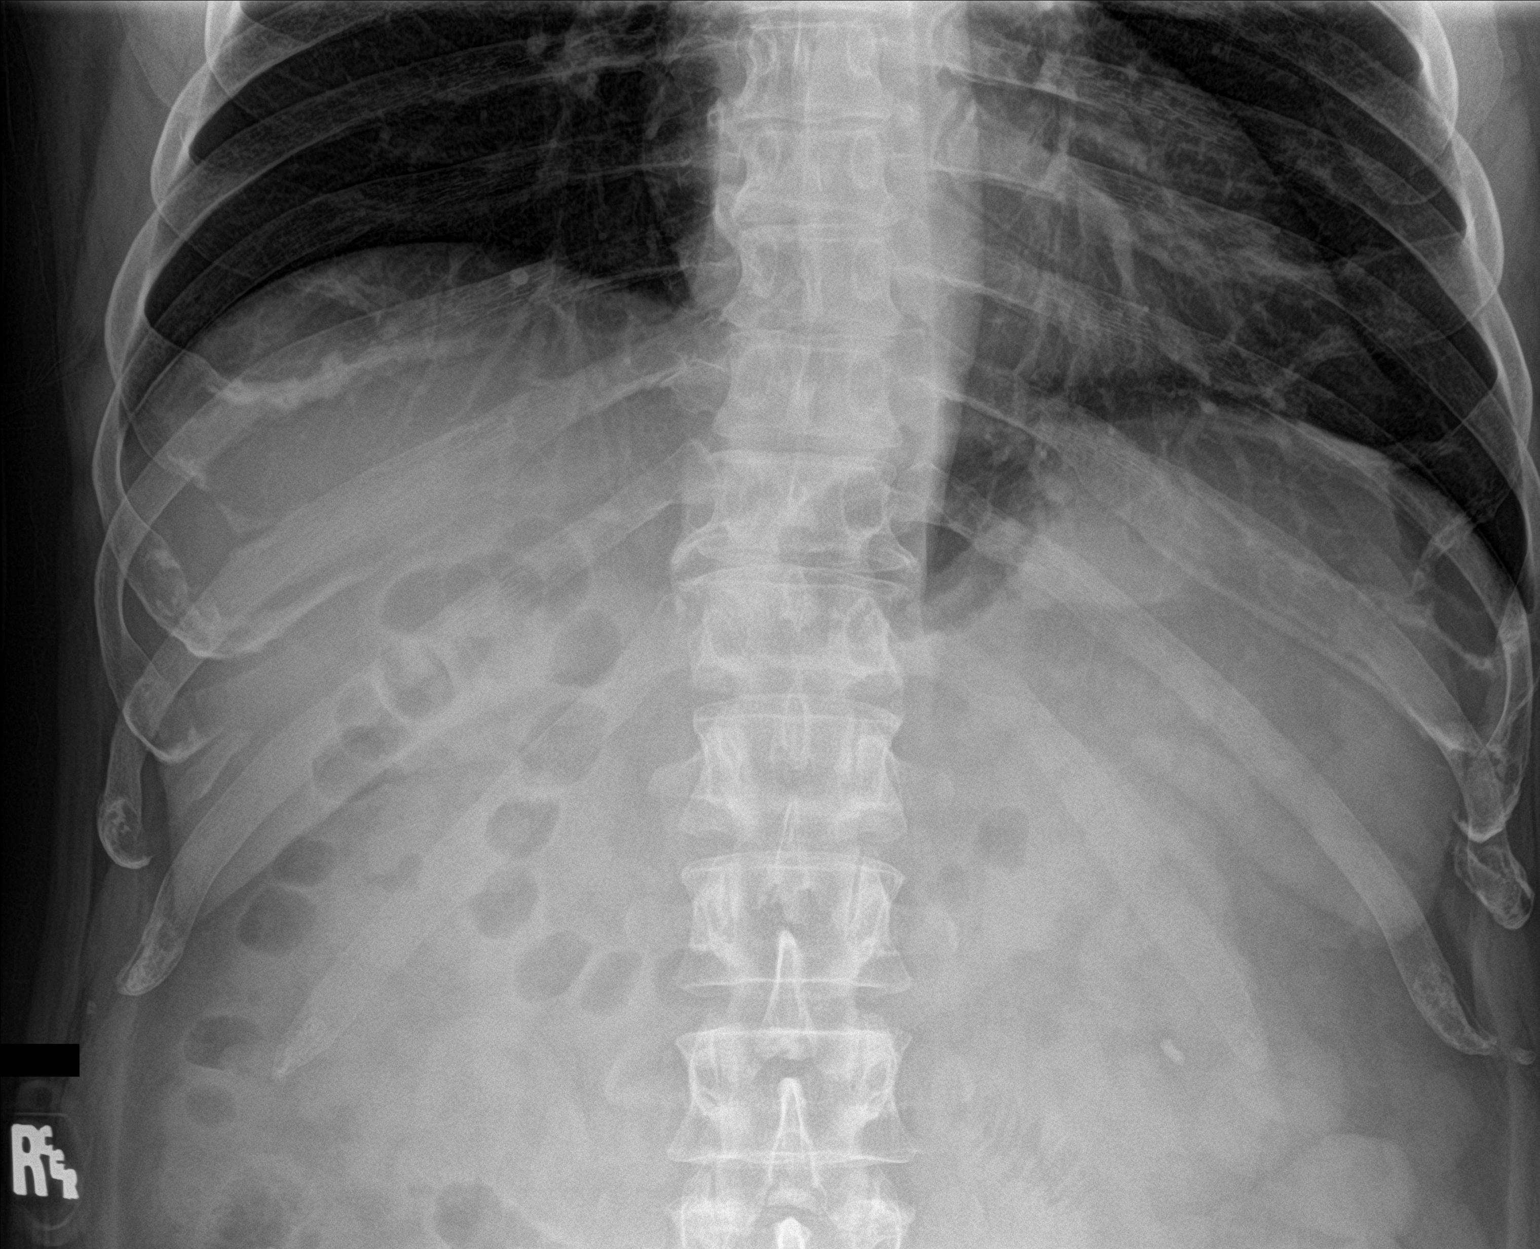
[im 2/2]
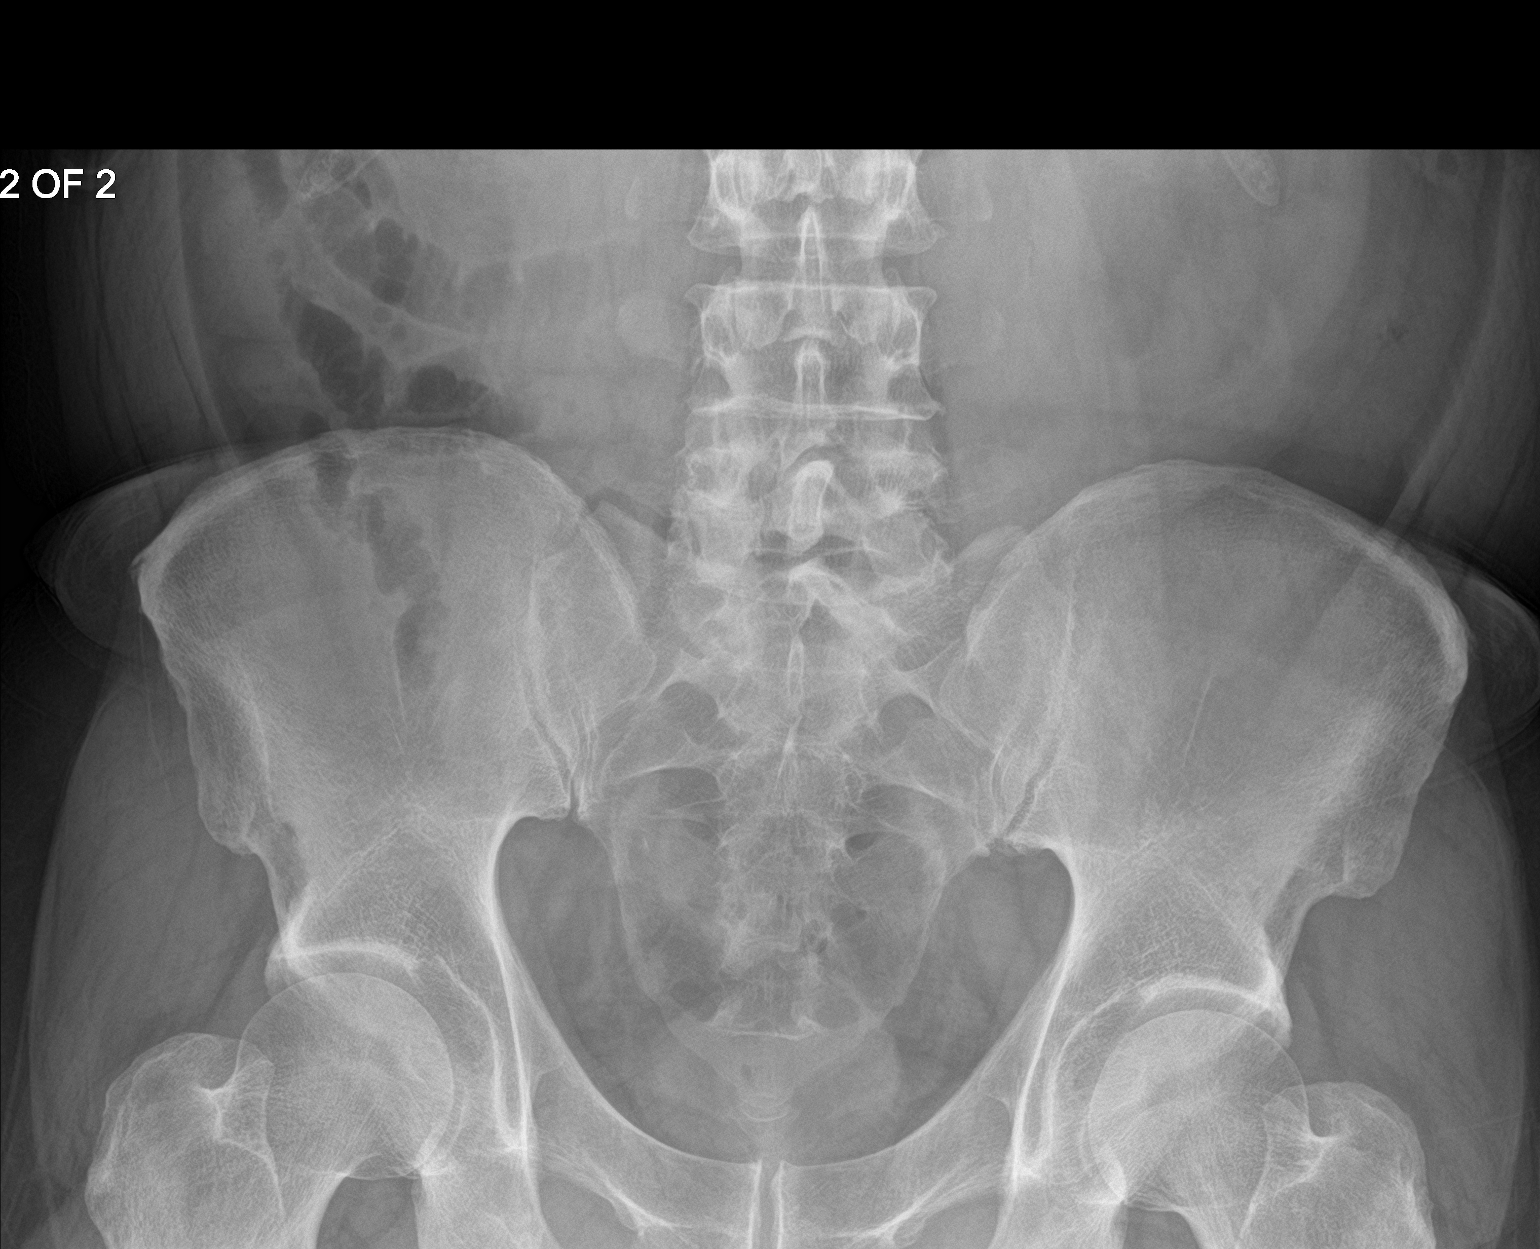

[2 of 2 positions shown; findings below may reference images not displayed]

FINDINGS: Soft tissue structures are unremarkable. Left nephrolithiasis again
noted. No interim change. No evidence of urolithiasis. No acute bony
abnormality.
IMPRESSION: Left nephrolithiasis again noted. No interim change. No evidence of
urolithiasis.

## 2023-02-12 IMAGING — US US RENAL
1 series · 14 of 25 positions shown · non-contrast
Comparison: CT abdomen and pelvis October 24, 2020

CLINICAL DATA: Nephrolithiasis

EXAM:
RENAL / URINARY TRACT ULTRASOUND COMPLETE

[Series 1: us renal · 0.25mm/px · 14 of 56 slices shown]
[im 1/56]
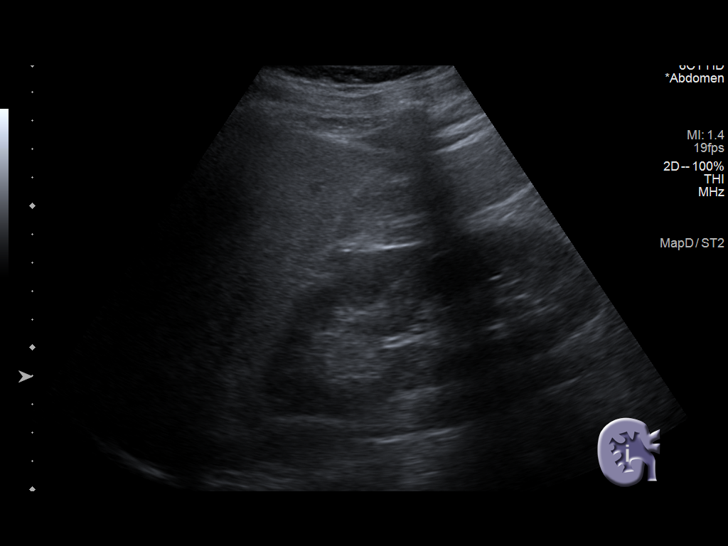
[im 5/56]
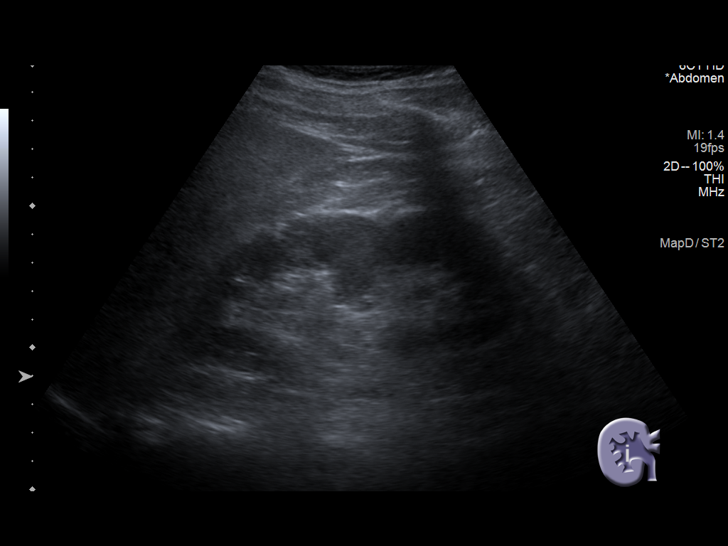
[im 10/56]
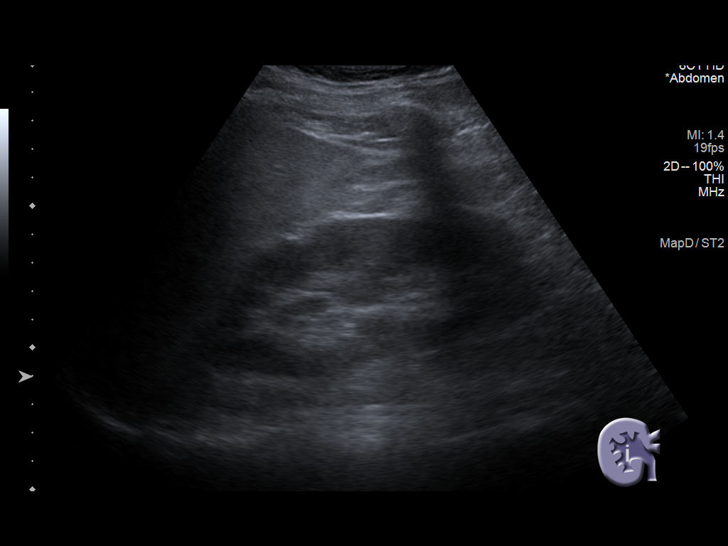
[im 14/56]
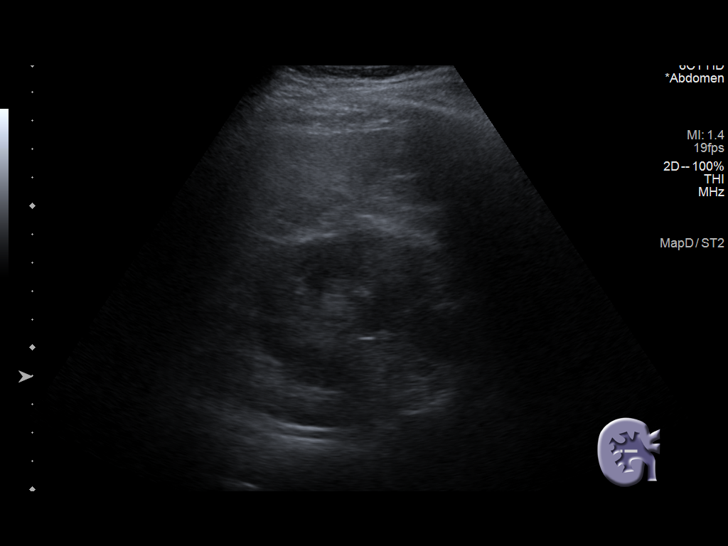
[im 19/56]
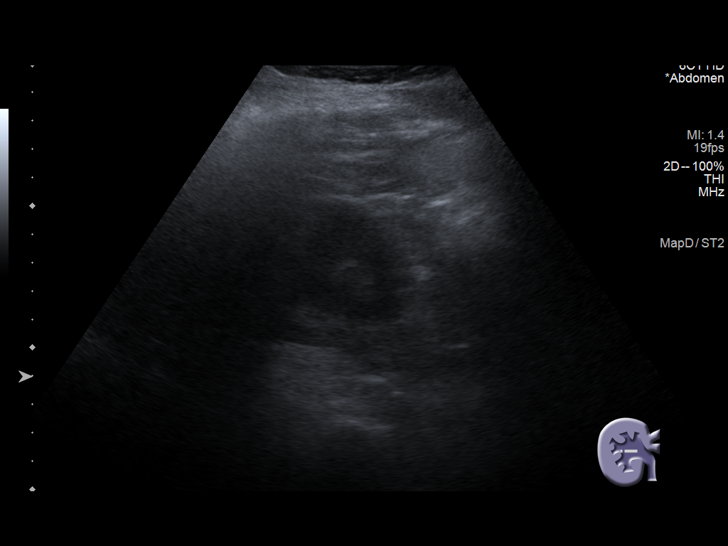
[im 21/56]
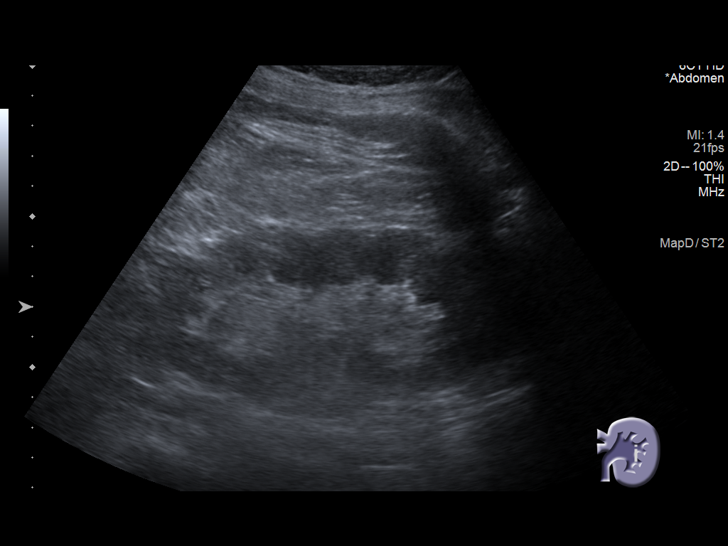
[im 26/56]
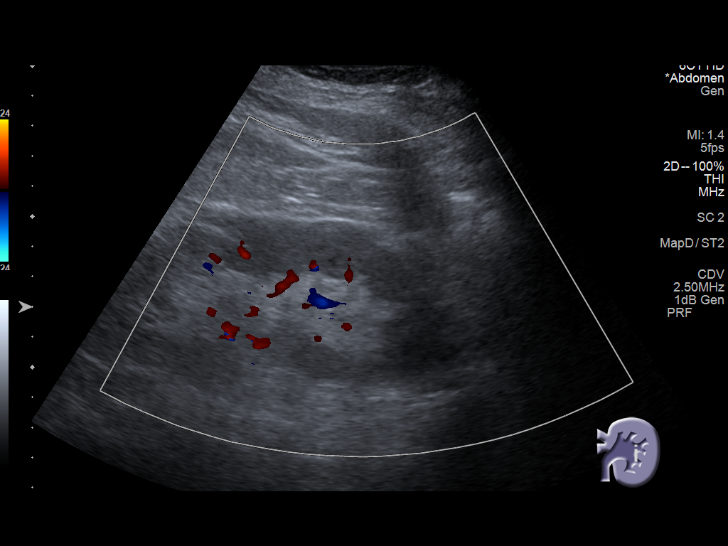
[im 30/56]
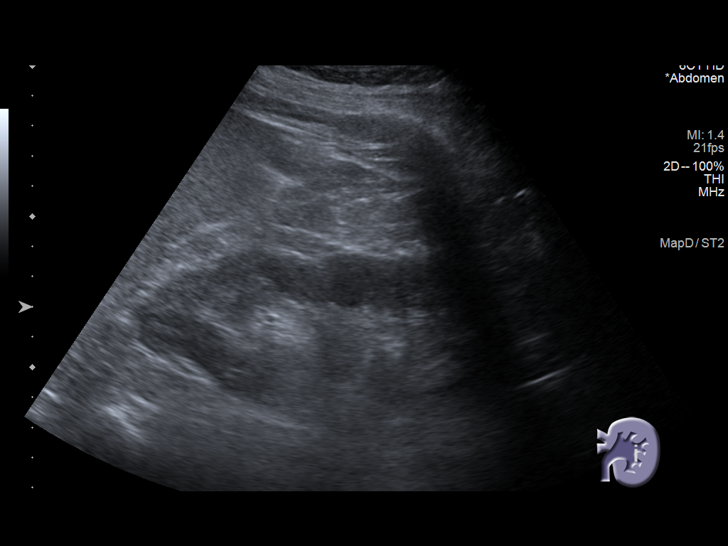
[im 35/56]
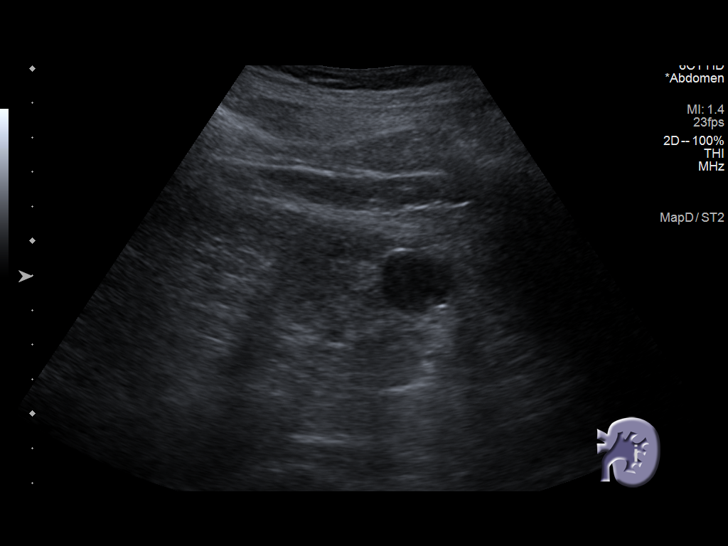
[im 37/56]
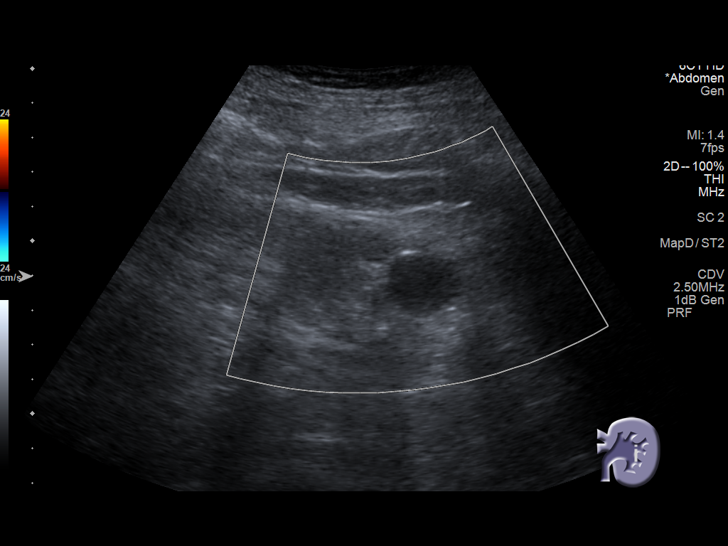
[im 42/56]
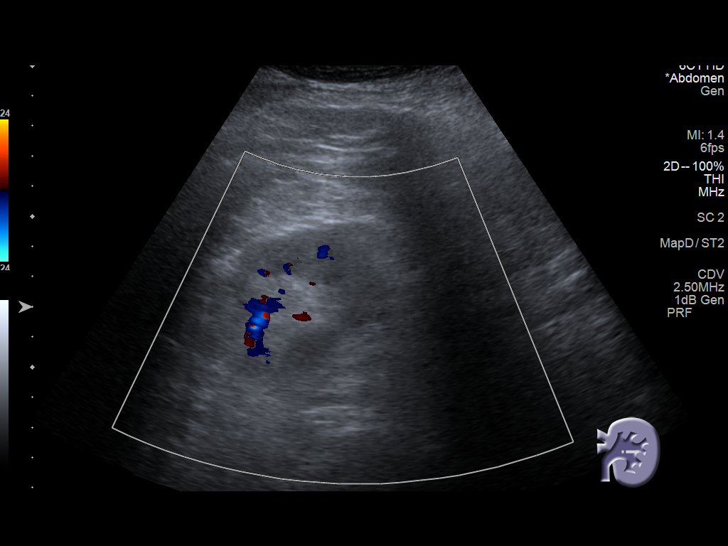
[im 46/56]
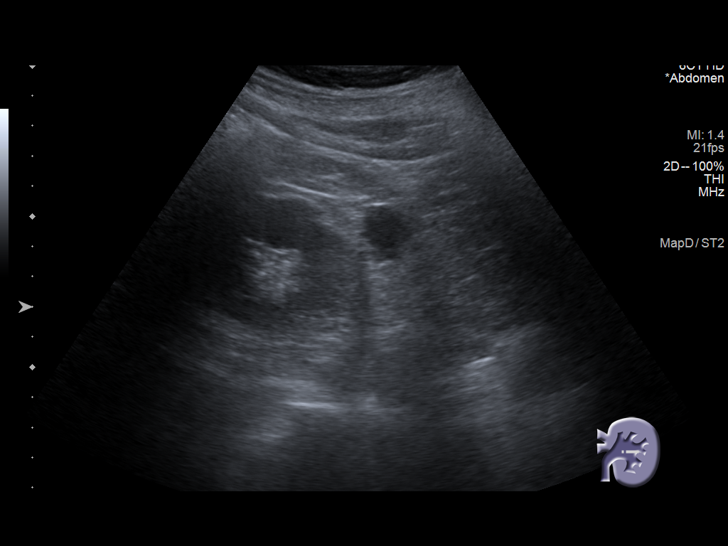
[im 51/56]
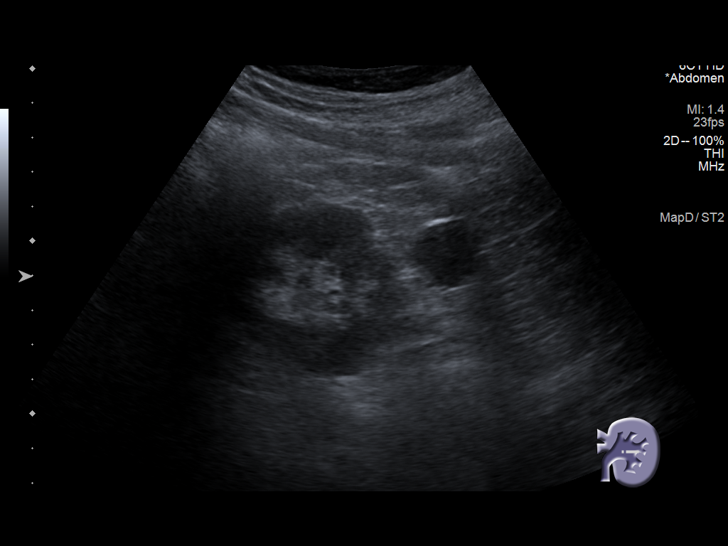
[im 56/56]
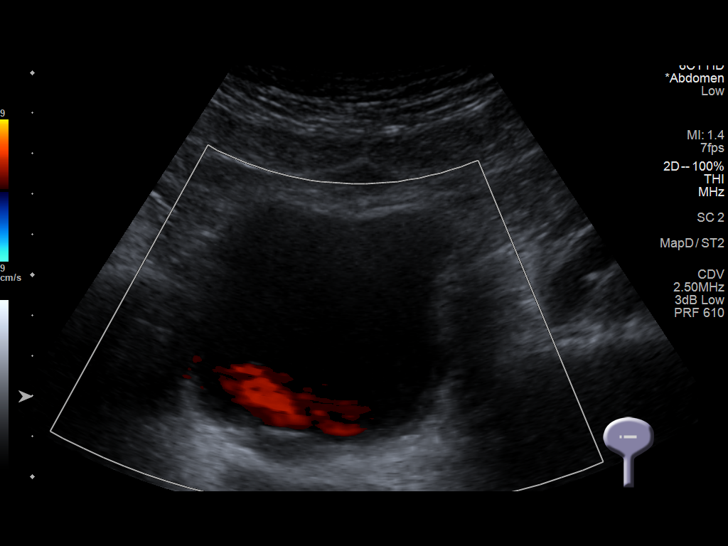

[14 of 25 positions shown; findings below may reference images not displayed]

FINDINGS: Right Kidney:

Renal measurements: 12.3 x 5.4 x 5.9 cm = volume: 204.0 mL.
Echogenicity and renal cortical thickness are within normal limits.
No mass, perinephric fluid, or hydronephrosis visualized. No
sonographically demonstrable calculus or ureterectasis.

Left Kidney:

Renal measurements: 12.4 x 5.2 x 6.1 cm = volume: 204.2 mL.
Echogenicity and renal cortical thickness are within normal limits.
No perinephric fluid or hydronephrosis visualized. There is a cyst
arising from the lower pole left kidney measuring 2.3 x 2.1 x
cm. No sonographically demonstrable calculus or ureterectasis.

Bladder:

Appears normal for degree of bladder distention.

Other:

None.
IMPRESSION: Cyst arising from lower pole left kidney measuring 2.3 x 2.1 x
cm. Study otherwise unremarkable.

## 2023-11-08 ENCOUNTER — Ambulatory Visit (INDEPENDENT_AMBULATORY_CARE_PROVIDER_SITE_OTHER): Admitting: Podiatry

## 2023-11-08 ENCOUNTER — Ambulatory Visit (INDEPENDENT_AMBULATORY_CARE_PROVIDER_SITE_OTHER)

## 2023-11-08 ENCOUNTER — Encounter: Payer: Self-pay | Admitting: Podiatry

## 2023-11-08 DIAGNOSIS — Q828 Other specified congenital malformations of skin: Secondary | ICD-10-CM | POA: Diagnosis not present

## 2023-11-08 DIAGNOSIS — M7751 Other enthesopathy of right foot: Secondary | ICD-10-CM

## 2023-11-08 MED ORDER — TRIAMCINOLONE ACETONIDE 10 MG/ML IJ SUSP
10.0000 mg | Freq: Once | INTRAMUSCULAR | Status: AC
  Administered 2023-11-08: 10 mg via INTRA_ARTICULAR

## 2023-11-09 NOTE — Progress Notes (Signed)
 Subjective:   Patient ID: Kirk Mueller, male   DOB: 63 y.o.   MRN: [id number]   HPI Patient states he developed a lot of pain in his right forefoot and states that it has been inflamed and that he does not know if it is around the surgery we had done for years ago.  Also complaining of lesions on both feet that get tender and make it hard for him at times to wear certain shoe gear.  Patient does not smoke likes to be active   Review of Systems  All other systems reviewed and are negative.       Objective:  Physical Exam Vitals and nursing note reviewed.  Constitutional:      Appearance: He is well-developed.  Pulmonary:     Effort: Pulmonary effort is normal.  Musculoskeletal:        General: Normal range of motion.  Skin:    General: Skin is warm.  Neurological:     Mental Status: He is alert.     Neurovascular status intact muscle strength bilaterally adequate range of motion within normal limits.  The first MPJ triplanar correction done 4 years ago was doing well good range of motion no crepitus within the joint but there is inflammation fluid around the first MPJ and small lesions on the lateral side of both feet with course.  Good digital perfusion well-oriented x 3 and     Assessment:   inflammatory capsulitis first MPJ right but I think it is more inflammatory versus true structural and porokeratotic lesions     Plan:  H&P x-rays reviewed and I went ahead today did sterile prep injected periarticular first MPJ right 3 mg Dexasone Kenalog 5 mg Xylocaine  and then debrided the lesion courtesy no iatrogenic bleeding reappoint routine care  X-rays indicate the joint is still rounded fixation is in place good alignment noted

## 2024-01-31 IMAGING — CT CT ABD-PELV W/O CM
2 of 4 series · 16 of 46 positions shown, 18 images · non-contrast
Comparison: CT abdomen and pelvis 10/24/2020

CLINICAL DATA: 61-year-old with flank pain, kidney stone suspected.



[Series 3: renal stone 5.0 · axial · 0.89mm/px · z∈[+786,+1266]mm · 13 of 106 slices shown, 15 images]
[im 5/106  soft-tissue]
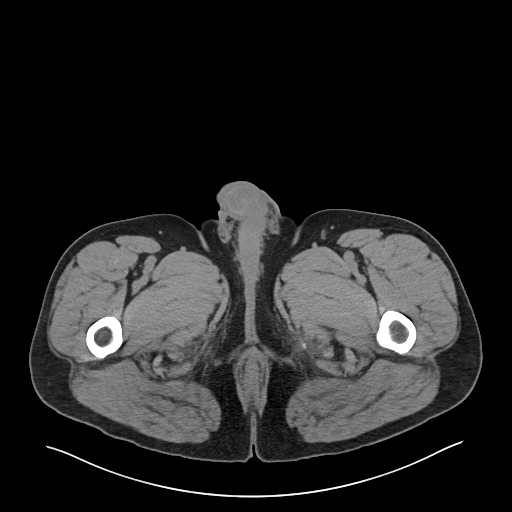
[im 5/106  bone]
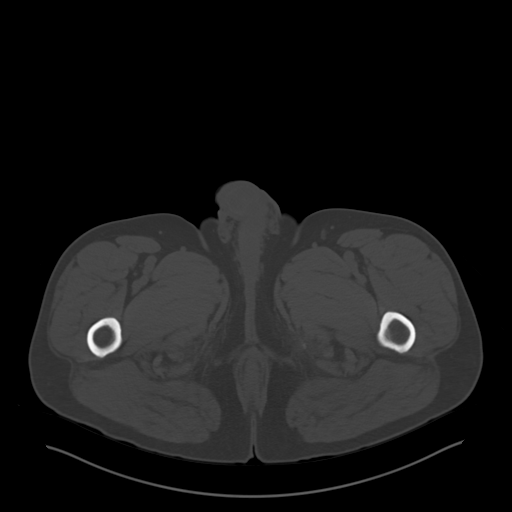
[im 14/106  soft-tissue]
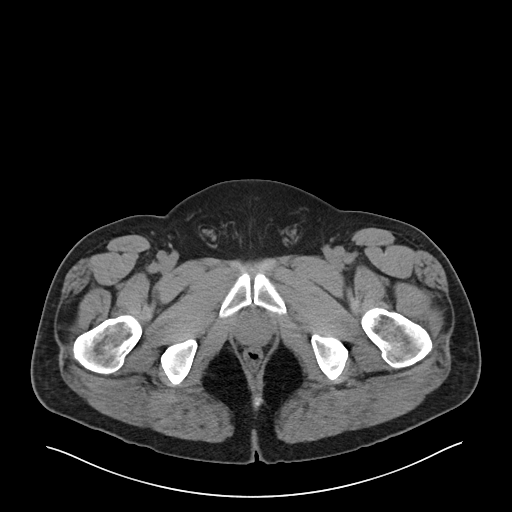
[im 23/106  soft-tissue]
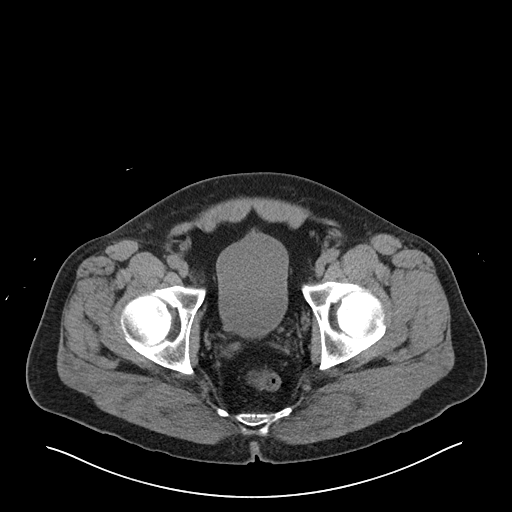
[im 28/106  soft-tissue]
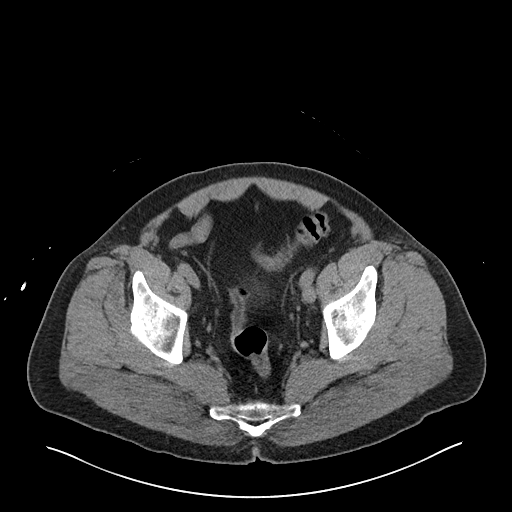
[im 37/106  soft-tissue]
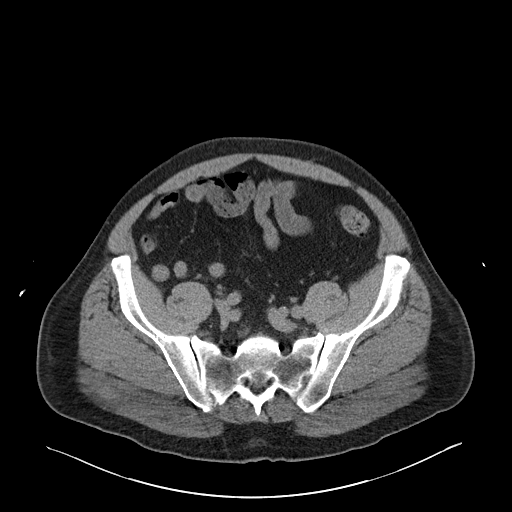
[im 46/106  soft-tissue]
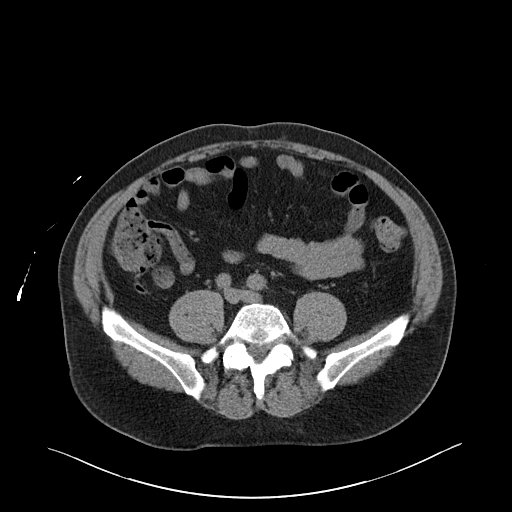
[im 55/106  soft-tissue]
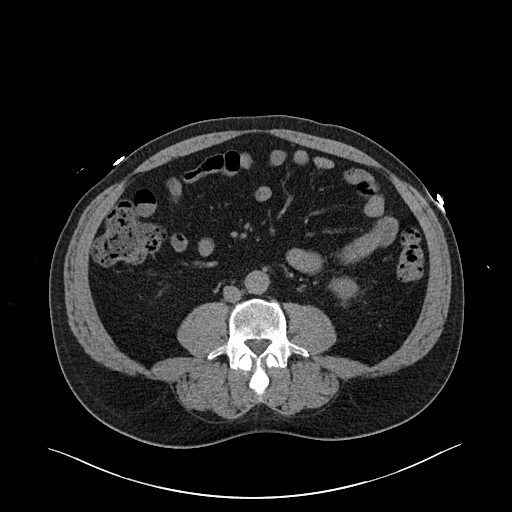
[im 60/106  soft-tissue]
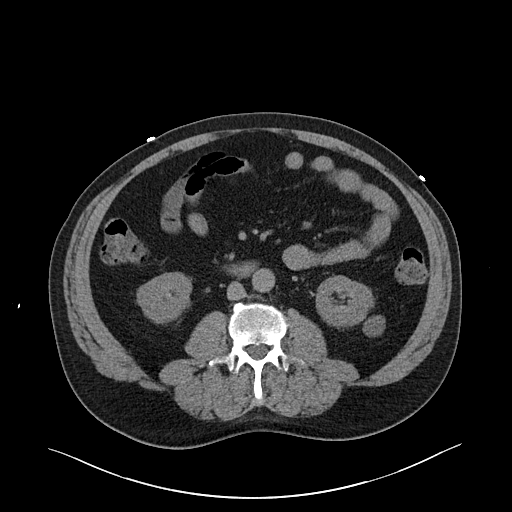
[im 69/106  soft-tissue]
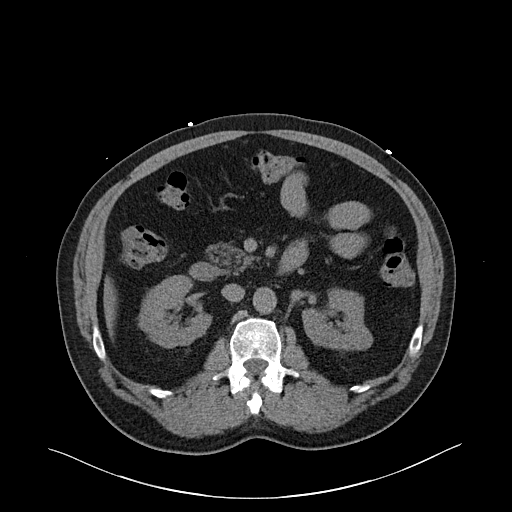
[im 69/106  bone]
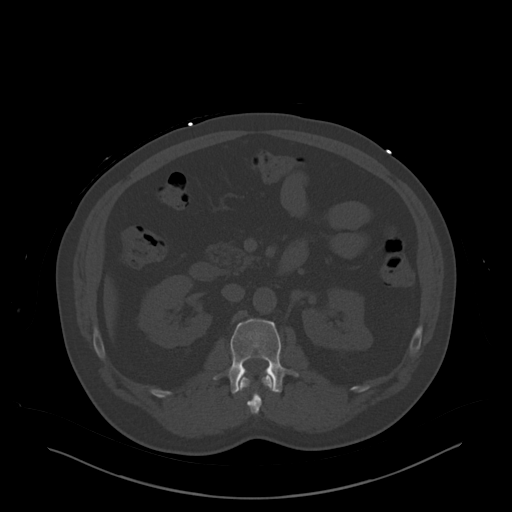
[im 78/106  soft-tissue]
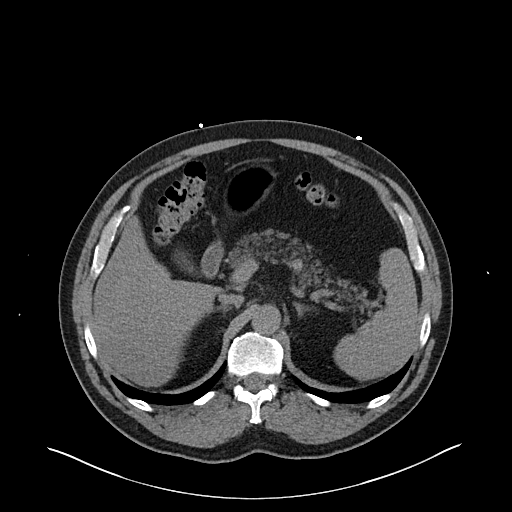
[im 83/106  soft-tissue]
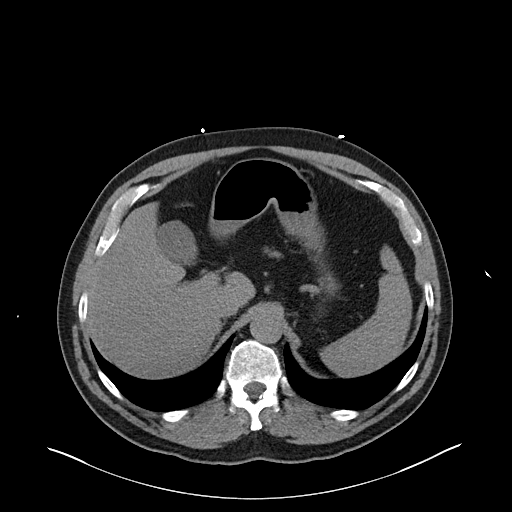
[im 92/106  soft-tissue]
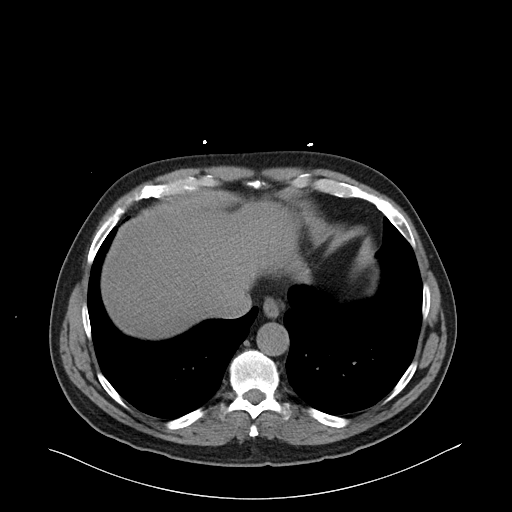
[im 101/106  soft-tissue]
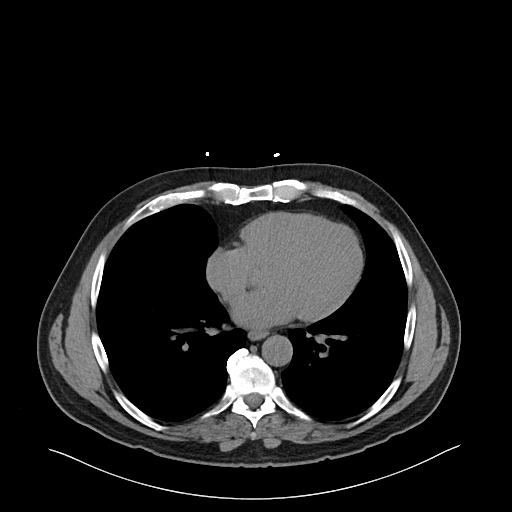

[Series 7: cor · coronal · 0.84mm/px · 3 of 160 slices shown]
[im 54/160  soft-tissue]
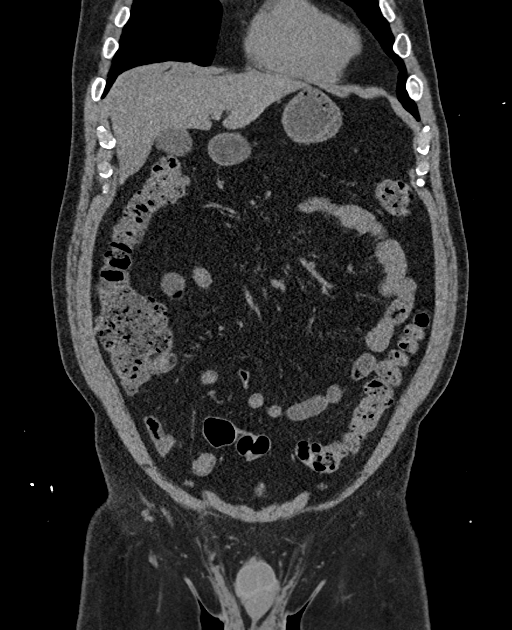
[im 71/160  soft-tissue]
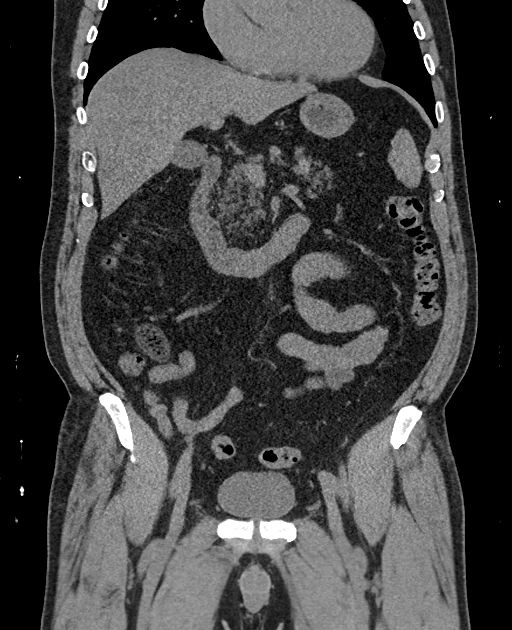
[im 89/160  soft-tissue]
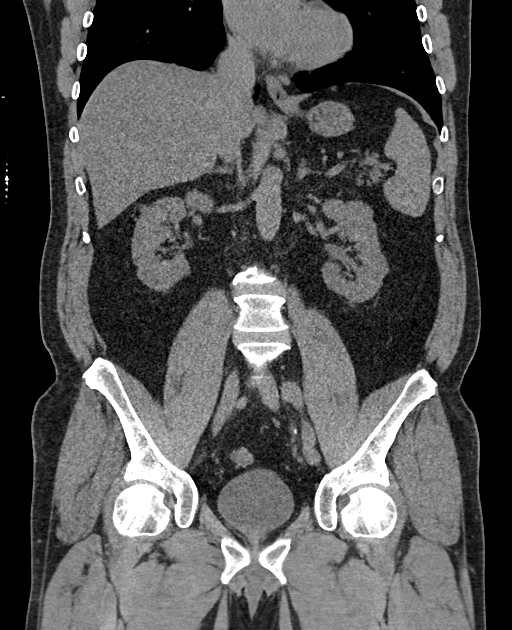

[16 of 46 positions shown; findings below may reference images not displayed]

FINDINGS: Lower chest: Lung bases are clear.  No pleural effusions.

Hepatobiliary: Normal appearance of the liver and gallbladder.

Pancreas: Unremarkable. No pancreatic ductal dilatation or
surrounding inflammatory changes.

Spleen: Focal low-density in the central aspect of the spleen is
chronic and likely an incidental finding. Spleen is normal for size.

Adrenals/Urinary Tract: Normal adrenal glands. Kidneys have a
slightly lobulated configuration and similar to the previous
examination. Normal appearance of the urinary bladder. No
hydronephrosis. Again noted is an exophytic structure coming off the
left kidney lower pole with water attenuation that measures
approximately 2.2 cm. This is most compatible with a renal cyst and
does not require follow-up imaging. 2 mm stone in the right kidney
lower pole. 2 or 3 tiny stones in the left kidney. No evidence for a
ureter stone.

Stomach/Bowel: Stomach is within normal limits. Appendix appears
normal. No evidence of bowel wall thickening, distention, or
inflammatory changes.

Vascular/Lymphatic: No significant vascular findings are present.
Minimal calcifications involving iliac arteries. No enlarged
abdominal or pelvic lymph nodes.

Reproductive: Prostate is unremarkable.

Other: Negative for free fluid or free air.

Musculoskeletal: No acute bone abnormality.
IMPRESSION: 1. Nonobstructive renal calculi. Negative for hydronephrosis. No
acute inflammatory changes in the abdomen or pelvis.

## 2024-04-06 ENCOUNTER — Emergency Department (HOSPITAL_COMMUNITY)

## 2024-04-06 ENCOUNTER — Encounter (HOSPITAL_COMMUNITY): Payer: Self-pay | Admitting: Emergency Medicine

## 2024-04-06 ENCOUNTER — Emergency Department (HOSPITAL_COMMUNITY)
Admission: EM | Admit: 2024-04-06 | Discharge: 2024-04-06 | Disposition: A | Discharge status: Home / Self Care | Attending: Emergency Medicine | Admitting: Emergency Medicine

## 2024-04-06 ENCOUNTER — Other Ambulatory Visit: Payer: Self-pay

## 2024-04-06 DIAGNOSIS — I10 Essential (primary) hypertension: Secondary | ICD-10-CM | POA: Diagnosis not present

## 2024-04-06 DIAGNOSIS — H539 Unspecified visual disturbance: Secondary | ICD-10-CM | POA: Diagnosis present

## 2024-04-06 LAB — I-STAT CHEM 8, ED
BUN: 15 mg/dL (ref 8–23)
Calcium, Ion: 1.2 mmol/L (ref 1.15–1.40)
Chloride: 105 mmol/L (ref 98–111)
Creatinine, Ser: 0.9 mg/dL (ref 0.61–1.24)
Glucose, Bld: 139 mg/dL — ABNORMAL HIGH (ref 70–99)
HCT: 52 % (ref 39.0–52.0)
Hemoglobin: 17.7 g/dL — ABNORMAL HIGH (ref 13.0–17.0)
Potassium: 4.7 mmol/L (ref 3.5–5.1)
Sodium: 140 mmol/L (ref 135–145)
TCO2: 23 mmol/L (ref 22–32)

## 2024-04-06 MED ORDER — IOHEXOL 350 MG/ML SOLN
75.0000 mL | Freq: Once | INTRAVENOUS | Status: AC | PRN
  Administered 2024-04-06: 75 mL via INTRAVENOUS
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
## 2024-04-06 NOTE — Discharge Instructions (Signed)
 The workup is reassuring.  Follow-up with either your ophthalmologist through the [facility] or Dr. Marcey.
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
